# Patient Record
Sex: Male | Born: 1960 | Race: White | Hispanic: No | Marital: Married | Smoking: Never smoker
Health system: Southern US, Community
[De-identification: ages and names within clinical notes are randomized; demographics above are authoritative.]

## PROBLEM LIST (undated history)

## (undated) DIAGNOSIS — H9190 Unspecified hearing loss, unspecified ear: Secondary | ICD-10-CM

## (undated) DIAGNOSIS — S2232XA Fracture of one rib, left side, initial encounter for closed fracture: Secondary | ICD-10-CM

## (undated) DIAGNOSIS — F32A Depression, unspecified: Secondary | ICD-10-CM

## (undated) DIAGNOSIS — M5416 Radiculopathy, lumbar region: Secondary | ICD-10-CM

## (undated) DIAGNOSIS — G4730 Sleep apnea, unspecified: Secondary | ICD-10-CM

## (undated) DIAGNOSIS — I10 Essential (primary) hypertension: Secondary | ICD-10-CM

## (undated) DIAGNOSIS — M1990 Unspecified osteoarthritis, unspecified site: Secondary | ICD-10-CM

## (undated) DIAGNOSIS — Z86010 Personal history of colonic polyps: Secondary | ICD-10-CM

## (undated) HISTORY — PX: WISDOM TOOTH EXTRACTION: SHX21

## (undated) HISTORY — DX: Fracture of one rib, left side, initial encounter for closed fracture: S22.32XA

## (undated) HISTORY — PX: POLYPECTOMY: SHX149

## (undated) HISTORY — DX: Essential (primary) hypertension: I10

## (undated) HISTORY — DX: Sleep apnea, unspecified: G47.30

## (undated) HISTORY — PX: TYMPANOSTOMY: SHX2586

## (undated) HISTORY — DX: Radiculopathy, lumbar region: M54.16

## (undated) HISTORY — DX: Unspecified osteoarthritis, unspecified site: M19.90

## (undated) HISTORY — PX: EYE SURGERY: SHX253

## (undated) HISTORY — DX: Depression, unspecified: F32.A

## (undated) HISTORY — DX: Personal history of colonic polyps: Z86.010

## (undated) HISTORY — DX: Unspecified hearing loss, unspecified ear: H91.90

## (undated) HISTORY — PX: TONSILLECTOMY: SUR1361

## (undated) HISTORY — PX: SPINE SURGERY: SHX786

---

## 1999-12-25 ENCOUNTER — Ambulatory Visit (HOSPITAL_COMMUNITY): Admission: RE | Admit: 1999-12-25 | Discharge: 1999-12-25 | Payer: Self-pay | Admitting: Otolaryngology

## 1999-12-25 ENCOUNTER — Encounter: Payer: Self-pay | Admitting: Otolaryngology

## 2000-05-17 ENCOUNTER — Ambulatory Visit (HOSPITAL_COMMUNITY): Admission: RE | Admit: 2000-05-17 | Discharge: 2000-05-17 | Payer: Self-pay | Admitting: Neurology

## 2000-05-17 ENCOUNTER — Encounter: Payer: Self-pay | Admitting: Neurology

## 2001-05-13 ENCOUNTER — Emergency Department (HOSPITAL_COMMUNITY): Admission: EM | Admit: 2001-05-13 | Discharge: 2001-05-13 | Payer: Self-pay | Admitting: Emergency Medicine

## 2004-01-18 ENCOUNTER — Encounter: Admission: RE | Admit: 2004-01-18 | Discharge: 2004-01-18 | Payer: Self-pay | Admitting: Internal Medicine

## 2004-06-18 ENCOUNTER — Ambulatory Visit: Payer: Self-pay | Admitting: Internal Medicine

## 2004-07-04 ENCOUNTER — Ambulatory Visit: Payer: Self-pay | Admitting: Internal Medicine

## 2004-09-18 ENCOUNTER — Ambulatory Visit: Payer: Self-pay | Admitting: Internal Medicine

## 2005-04-03 ENCOUNTER — Ambulatory Visit: Payer: Self-pay | Admitting: Internal Medicine

## 2005-04-03 ENCOUNTER — Ambulatory Visit: Payer: Self-pay | Admitting: Cardiology

## 2005-04-07 ENCOUNTER — Ambulatory Visit: Payer: Self-pay | Admitting: Internal Medicine

## 2005-05-27 ENCOUNTER — Ambulatory Visit: Payer: Self-pay | Admitting: Internal Medicine

## 2005-06-03 ENCOUNTER — Ambulatory Visit: Payer: Self-pay | Admitting: Internal Medicine

## 2005-08-11 ENCOUNTER — Ambulatory Visit: Payer: Self-pay | Admitting: Internal Medicine

## 2005-08-14 ENCOUNTER — Ambulatory Visit (HOSPITAL_COMMUNITY): Admission: RE | Admit: 2005-08-14 | Discharge: 2005-08-14 | Payer: Self-pay | Admitting: Internal Medicine

## 2005-09-01 ENCOUNTER — Ambulatory Visit: Payer: Self-pay | Admitting: Internal Medicine

## 2005-09-30 ENCOUNTER — Ambulatory Visit: Payer: Self-pay | Admitting: Internal Medicine

## 2006-03-16 HISTORY — PX: OTHER SURGICAL HISTORY: SHX169

## 2006-05-24 ENCOUNTER — Ambulatory Visit: Payer: Self-pay | Admitting: Internal Medicine

## 2006-05-24 LAB — CONVERTED CEMR LAB
ALT: 29 units/L (ref 0–40)
AST: 27 units/L (ref 0–37)
Albumin: 3.9 g/dL (ref 3.5–5.2)
Alkaline Phosphatase: 49 units/L (ref 39–117)
BUN: 15 mg/dL (ref 6–23)
Basophils Absolute: 0 10*3/uL (ref 0.0–0.1)
Basophils Relative: 0.4 % (ref 0.0–1.0)
Bilirubin, Direct: 0.1 mg/dL (ref 0.0–0.3)
CO2: 32 meq/L (ref 19–32)
Calcium: 9.2 mg/dL (ref 8.4–10.5)
Chloride: 103 meq/L (ref 96–112)
Cholesterol: 191 mg/dL (ref 0–200)
Creatinine, Ser: 0.9 mg/dL (ref 0.4–1.5)
Eosinophils Absolute: 0.1 10*3/uL (ref 0.0–0.6)
Eosinophils Relative: 1.6 % (ref 0.0–5.0)
GFR calc Af Amer: 118 mL/min
GFR calc non Af Amer: 97 mL/min
Glucose, Bld: 84 mg/dL (ref 70–99)
HCT: 43.4 % (ref 39.0–52.0)
HDL: 46.4 mg/dL (ref >39.0)
Hemoglobin: 14.7 g/dL (ref 13.0–17.0)
LDL Cholesterol: 124 mg/dL — ABNORMAL HIGH (ref 0–99)
Lymphocytes Relative: 27.5 % (ref 12.0–46.0)
MCHC: 34 g/dL (ref 30.0–36.0)
MCV: 93.3 fL (ref 78.0–100.0)
Monocytes Absolute: 0.4 10*3/uL (ref 0.2–0.7)
Monocytes Relative: 8.6 % (ref 3.0–11.0)
Neutro Abs: 2.9 10*3/uL (ref 1.4–7.7)
Neutrophils Relative %: 61.9 % (ref 43.0–77.0)
PSA: 0.52 ng/mL (ref 0.10–4.00)
Platelets: 192 10*3/uL (ref 150–400)
Potassium: 4 meq/L (ref 3.5–5.1)
RBC: 4.65 M/uL (ref 4.22–5.81)
RDW: 12.2 % (ref 11.5–14.6)
Sodium: 140 meq/L (ref 135–145)
TSH: 1.29 microintl units/mL (ref 0.35–5.50)
Total Bilirubin: 0.9 mg/dL (ref 0.3–1.2)
Total CHOL/HDL Ratio: 4.1
Total Protein: 6.8 g/dL (ref 6.0–8.3)
Triglycerides: 101 mg/dL (ref 0–149)
VLDL: 20 mg/dL (ref 0–40)
WBC: 4.7 10*3/uL (ref 4.5–10.5)

## 2006-05-28 ENCOUNTER — Ambulatory Visit: Payer: Self-pay | Admitting: Internal Medicine

## 2006-06-25 ENCOUNTER — Ambulatory Visit: Payer: Self-pay | Admitting: Internal Medicine

## 2006-09-22 ENCOUNTER — Ambulatory Visit (HOSPITAL_COMMUNITY): Admission: RE | Admit: 2006-09-22 | Discharge: 2006-09-23 | Payer: Self-pay | Admitting: Neurosurgery

## 2007-06-06 ENCOUNTER — Ambulatory Visit: Payer: Self-pay | Admitting: Internal Medicine

## 2007-06-06 LAB — CONVERTED CEMR LAB
ALT: 32 units/L (ref 0–53)
AST: 33 units/L (ref 0–37)
Basophils Relative: 0.9 % (ref 0.0–1.0)
Bilirubin Urine: NEGATIVE
Bilirubin, Direct: 0.1 mg/dL (ref 0.0–0.3)
Blood in Urine, dipstick: NEGATIVE
CO2: 26 meq/L (ref 19–32)
Calcium: 9.1 mg/dL (ref 8.4–10.5)
Chloride: 106 meq/L (ref 96–112)
Eosinophils Absolute: 0.1 10*3/uL (ref 0.0–0.6)
Eosinophils Relative: 2.6 % (ref 0.0–5.0)
GFR calc non Af Amer: 97 mL/min
Glucose, Bld: 79 mg/dL (ref 70–99)
Glucose, Urine, Semiquant: NEGATIVE
HCT: 43 % (ref 39.0–52.0)
Ketones, urine, test strip: NEGATIVE
Lymphocytes Relative: 40.3 % (ref 12.0–46.0)
MCV: 93 fL (ref 78.0–100.0)
Neutrophils Relative %: 46.7 % (ref 43.0–77.0)
Nitrite: NEGATIVE
PSA: 0.48 ng/mL (ref 0.10–4.00)
Platelets: 207 10*3/uL (ref 150–400)
Protein, U semiquant: NEGATIVE
RBC: 4.63 M/uL (ref 4.22–5.81)
Sodium: 140 meq/L (ref 135–145)
Specific Gravity, Urine: 1.01
Total Bilirubin: 0.7 mg/dL (ref 0.3–1.2)
Total Protein: 6.7 g/dL (ref 6.0–8.3)
Urobilinogen, UA: 0.2
VLDL: 66 mg/dL — ABNORMAL HIGH (ref 0–40)
WBC Urine, dipstick: NEGATIVE
WBC: 4.2 10*3/uL — ABNORMAL LOW (ref 4.5–10.5)
pH: 5.5

## 2007-06-10 ENCOUNTER — Ambulatory Visit: Payer: Self-pay | Admitting: Internal Medicine

## 2007-06-10 DIAGNOSIS — I10 Essential (primary) hypertension: Secondary | ICD-10-CM

## 2007-06-10 DIAGNOSIS — M1990 Unspecified osteoarthritis, unspecified site: Secondary | ICD-10-CM | POA: Insufficient documentation

## 2007-06-23 ENCOUNTER — Ambulatory Visit: Payer: Self-pay | Admitting: Internal Medicine

## 2007-07-19 DIAGNOSIS — R079 Chest pain, unspecified: Secondary | ICD-10-CM | POA: Insufficient documentation

## 2007-08-26 ENCOUNTER — Telehealth (INDEPENDENT_AMBULATORY_CARE_PROVIDER_SITE_OTHER): Payer: Self-pay | Admitting: *Deleted

## 2007-09-09 ENCOUNTER — Ambulatory Visit: Payer: Self-pay | Admitting: Internal Medicine

## 2007-09-09 DIAGNOSIS — E785 Hyperlipidemia, unspecified: Secondary | ICD-10-CM | POA: Insufficient documentation

## 2007-09-09 DIAGNOSIS — N411 Chronic prostatitis: Secondary | ICD-10-CM | POA: Insufficient documentation

## 2007-09-09 LAB — CONVERTED CEMR LAB
LDL Cholesterol: 120 mg/dL — ABNORMAL HIGH (ref 0–99)
Total CHOL/HDL Ratio: 4.5
VLDL: 24 mg/dL (ref 0–40)

## 2008-01-11 ENCOUNTER — Telehealth: Payer: Self-pay | Admitting: Internal Medicine

## 2008-04-04 ENCOUNTER — Encounter: Payer: Self-pay | Admitting: Internal Medicine

## 2008-04-06 ENCOUNTER — Encounter: Admission: RE | Admit: 2008-04-06 | Discharge: 2008-04-06 | Payer: Self-pay | Admitting: Neurosurgery

## 2008-04-07 ENCOUNTER — Emergency Department (HOSPITAL_COMMUNITY): Admission: EM | Admit: 2008-04-07 | Discharge: 2008-04-07 | Payer: Self-pay | Admitting: Emergency Medicine

## 2008-06-22 ENCOUNTER — Ambulatory Visit: Payer: Self-pay | Admitting: Internal Medicine

## 2008-06-22 LAB — CONVERTED CEMR LAB
Albumin: 4.4 g/dL (ref 3.5–5.2)
Alkaline Phosphatase: 49 units/L (ref 39–117)
BUN: 12 mg/dL (ref 6–23)
Basophils Absolute: 0 10*3/uL (ref 0.0–0.1)
CO2: 29 meq/L (ref 19–32)
Calcium: 9.2 mg/dL (ref 8.4–10.5)
Cholesterol: 223 mg/dL — ABNORMAL HIGH (ref 0–200)
Creatinine, Ser: 0.9 mg/dL (ref 0.4–1.5)
Direct LDL: 145.3 mg/dL
Eosinophils Absolute: 0 10*3/uL (ref 0.0–0.7)
GFR calc non Af Amer: 96.47 mL/min (ref >60)
Glucose, Bld: 94 mg/dL (ref 70–99)
HDL: 60.4 mg/dL (ref >39.00)
Hemoglobin: 14.7 g/dL (ref 13.0–17.0)
Leukocytes, UA: NEGATIVE
Lymphocytes Relative: 21.8 % (ref 12.0–46.0)
MCHC: 34.8 g/dL (ref 30.0–36.0)
Monocytes Relative: 8.3 % (ref 3.0–12.0)
Neutro Abs: 3.7 10*3/uL (ref 1.4–7.7)
PSA: 0.49 ng/mL (ref 0.10–4.00)
Platelets: 151 10*3/uL (ref 150.0–400.0)
RDW: 12.5 % (ref 11.5–14.6)
Sodium: 139 meq/L (ref 135–145)
Specific Gravity, Urine: <=1.005 (ref 1.000–1.030)
TSH: 0.96 microintl units/mL (ref 0.35–5.50)
Total Bilirubin: 1.3 mg/dL — ABNORMAL HIGH (ref 0.3–1.2)
Triglycerides: 53 mg/dL (ref 0.0–149.0)
Urine Glucose: NEGATIVE mg/dL
Urobilinogen, UA: 0.2 (ref 0.0–1.0)
pH: 6 (ref 5.0–8.0)

## 2008-06-29 ENCOUNTER — Ambulatory Visit: Payer: Self-pay | Admitting: Internal Medicine

## 2008-09-21 ENCOUNTER — Telehealth: Payer: Self-pay | Admitting: Internal Medicine

## 2008-11-23 ENCOUNTER — Telehealth: Payer: Self-pay | Admitting: *Deleted

## 2009-06-25 ENCOUNTER — Ambulatory Visit: Payer: Self-pay | Admitting: Internal Medicine

## 2009-06-25 LAB — CONVERTED CEMR LAB
Albumin: 4.6 g/dL (ref 3.5–5.2)
Alkaline Phosphatase: 44 units/L (ref 39–117)
Basophils Relative: 0.6 % (ref 0.0–3.0)
CO2: 29 meq/L (ref 19–32)
Calcium: 9.2 mg/dL (ref 8.4–10.5)
Chloride: 103 meq/L (ref 96–112)
Eosinophils Absolute: 0.1 10*3/uL (ref 0.0–0.7)
Glucose, Urine, Semiquant: NEGATIVE
HCT: 43.9 % (ref 39.0–52.0)
Hemoglobin: 15.4 g/dL (ref 13.0–17.0)
Lymphocytes Relative: 31 % (ref 12.0–46.0)
MCHC: 35 g/dL (ref 30.0–36.0)
MCV: 95 fL (ref 78.0–100.0)
Neutro Abs: 2.3 10*3/uL (ref 1.4–7.7)
Nitrite: NEGATIVE
Potassium: 4.3 meq/L (ref 3.5–5.1)
RBC: 4.62 M/uL (ref 4.22–5.81)
Sodium: 140 meq/L (ref 135–145)
Specific Gravity, Urine: 1.015
Total CHOL/HDL Ratio: 3
Total Protein: 7.1 g/dL (ref 6.0–8.3)
WBC Urine, dipstick: NEGATIVE

## 2009-07-01 ENCOUNTER — Ambulatory Visit: Payer: Self-pay | Admitting: Internal Medicine

## 2009-07-01 LAB — CONVERTED CEMR LAB: Cholesterol, target level: 200 mg/dL

## 2009-07-08 ENCOUNTER — Encounter: Payer: Self-pay | Admitting: Internal Medicine

## 2010-01-01 ENCOUNTER — Telehealth: Payer: Self-pay | Admitting: Internal Medicine

## 2010-03-13 ENCOUNTER — Telehealth: Payer: Self-pay | Admitting: Internal Medicine

## 2010-04-15 NOTE — Assessment & Plan Note (Signed)
Summary: cpx/cjr   Vital Signs:  Patient profile:   50 year old male Height:      75 inches Weight:      210 pounds BMI:     26.34 Temp:     98.2 degrees F oral Pulse rate:   68 / minute Resp:     14 per minute BP sitting:   136 / 80  (left arm)  Vitals Entered By: Willy Eddy, LPN (July 01, 2009 2:12 PM)  Nutrition Counseling: Patient's BMI is greater than 25 and therefore counseled on weight management options. CC: cpx, Lipid Management   CC:  cpx and Lipid Management.  History of Present Illness: The pt was asked about all immunizations, health maint. services that are appropriate to their age and was given guidance on diet exercize  and weight management   Lipid Management History:      Positive NCEP/ATP III risk factors include male age 17 years old or older and hypertension.  Negative NCEP/ATP III risk factors include non-tobacco-user status.    Preventive Screening-Counseling & Management  Alcohol-Tobacco     Smoking Status: never  Problems Prior to Update: 1)  Chronic Prostatitis  (ICD-601.1) 2)  Hyperlipidemia  (ICD-272.4) 3)  Chest Pain Unspecified  (ICD-786.50) 4)  Preventive Health Care  (ICD-V70.0) 5)  Osteoarthritis  (ICD-715.90) 6)  Hypertension  (ICD-401.9) 7)  Routine General Medical Exam@health  Care Facl  (ICD-V70.0)  Current Problems (verified): 1)  Chronic Prostatitis  (ICD-601.1) 2)  Hyperlipidemia  (ICD-272.4) 3)  Chest Pain Unspecified  (ICD-786.50) 4)  Preventive Health Care  (ICD-V70.0) 5)  Osteoarthritis  (ICD-715.90) 6)  Hypertension  (ICD-401.9) 7)  Routine General Medical Exam@health  Care Facl  (ICD-V70.0)  Medications Prior to Update: 1)  Krill Oil 1000 Mg Caps (Krill Oil) .... Two By Mouth Two Times A Day 2)  Mega Multi Men  Cr-Tabs (Multiple Vitamins-Minerals) 3)  Ambien 10 Mg Tabs (Zolpidem Tartrate) .... One By Mouth Q Hs  Current Medications (verified): 1)  Krill Oil 1000 Mg Caps (Krill Oil) .... Two By Mouth Two  Times A Day 2)  Mega Multi Men  Cr-Tabs (Multiple Vitamins-Minerals) 3)  Ambien 10 Mg Tabs (Zolpidem Tartrate) .Marland Kitchen.. 1 At Bedtime As Needed Sleep When Traveling  Allergies (verified): No Known Drug Allergies  Past History:  Family History: Last updated: 09/09/2006 Fam hx Renal CA  Social History: Last updated: 09/09/2006 Never Smoked Alcohol use-yes Drug use-no Married  Risk Factors: Smoking Status: never (07/01/2009)  Past medical, surgical, family and social histories (including risk factors) reviewed, and no changes noted (except as noted below).  Past Medical History: Reviewed history from 06/10/2007 and no changes required. Hypertension RADICULOPATHY  L4-5 left Osteoarthritis  Past Surgical History: Reviewed history from 06/10/2007 and no changes required. l4-5  Lumbar laminectomy Lumbar fusion  ( roy)  Family History: Reviewed history from 09/09/2006 and no changes required. Fam hx Renal CA  Social History: Reviewed history from 09/09/2006 and no changes required. Never Smoked Alcohol use-yes Drug use-no Married  Review of Systems  The patient denies anorexia, fever, weight loss, weight gain, vision loss, decreased hearing, hoarseness, chest pain, syncope, dyspnea on exertion, peripheral edema, prolonged cough, headaches, hemoptysis, abdominal pain, melena, hematochezia, severe indigestion/heartburn, hematuria, incontinence, genital sores, muscle weakness, suspicious skin lesions, transient blindness, difficulty walking, depression, unusual weight change, abnormal bleeding, enlarged lymph nodes, angioedema, and breast masses.    Physical Exam  General:  Well-developed,well-nourished,in no acute distress; alert,appropriate and cooperative throughout examination Head:  Normocephalic and atraumatic without obvious abnormalities. No apparent alopecia or balding. Eyes:  No corneal or conjunctival inflammation noted. EOMI. Perrla. Funduscopic exam benign,  without hemorrhages, exudates or papilledema. Vision grossly normal. Nose:  External nasal examination shows no deformity or inflammation. Nasal mucosa are pink and moist without lesions or exudates. Mouth:  Oral mucosa and oropharynx without lesions or exudates.  Teeth in good repair. Neck:  No deformities, masses, or tenderness noted. Breasts:  No masses or gynecomastia noted Lungs:  Normal respiratory effort, chest expands symmetrically. Lungs are clear to auscultation, no crackles or wheezes. Heart:  Normal rate and regular rhythm. S1 and S2 normal without gallop, murmur, click, rub or other extra sounds. Abdomen:  Bowel sounds positive,abdomen soft and non-tender without masses, organomegaly or hernias noted. Genitalia:  Testes bilaterally descended without nodularity, tenderness or masses. No scrotal masses or lesions. No penis lesions or urethral discharge. Prostate:  no asymmetry and 1+ enlarged.   Msk:  No deformity or scoliosis noted of thoracic or lumbar spine.   Pulses:  R and L carotid,radial,femoral,dorsalis pedis and posterior tibial pulses are full and equal bilaterally Extremities:  No clubbing, cyanosis, edema, or deformity noted with normal full range of motion of all joints.   Neurologic:  No cranial nerve deficits noted. Station and gait are normal. Plantar reflexes are down-going bilaterally. DTRs are symmetrical throughout. Sensory, motor and coordinative functions appear intact. Skin:  Intact without suspicious lesions or rashes Psych:  Oriented X3 and memory intact for recent and remote.     Impression & Recommendations:  Problem # 1:  PREVENTIVE HEALTH CARE (ICD-V70.0)  Td Booster: Tdap (06/29/2008)   Flu Vax: Historical (03/17/2003)   Chol: 193 (06/25/2009)   HDL: 57.30 (06/25/2009)   LDL: 112 (06/25/2009)   TG: 118.0 (06/25/2009) TSH: 1.27 (06/25/2009)   PSA: 0.55 (06/25/2009)  Discussed using sunscreen, use of alcohol, drug use, self testicular exam, routine  dental care, routine eye care, routine physical exam, seat belts, multiple vitamins, osteoporosis prevention, adequate calcium intake in diet, and recommendations for immunizations.  Discussed exercise and checking cholesterol.  Discussed gun safety, safe sex, and contraception. Also recommend checking PSA.  Complete Medication List: 1)  Krill Oil 1000 Mg Caps (Krill oil) .... Two by mouth two times a day 2)  Mega Multi Men Cr-tabs (Multiple vitamins-minerals) 3)  Ambien 10 Mg Tabs (Zolpidem tartrate) .Marland Kitchen.. 1 at bedtime as needed sleep when traveling  Lipid Assessment/Plan:      Based on NCEP/ATP III, the patient's risk factor category is "2 or more risk factors and a calculated 10 year CAD risk of < 20%".  The patient's lipid goals are as follows: Total cholesterol goal is 200; LDL cholesterol goal is 130; HDL cholesterol goal is 40; Triglyceride goal is 150.     Patient Instructions: 1)  increase the saw palmeto to two times a day 2)  Please schedule a follow-up appointment in 1 year.  CPX  Appended Document: Orders Update     Clinical Lists Changes  Orders: Added new Service order of Est. Patient 40-64 years ([id number]) - Signed

## 2010-04-15 NOTE — Progress Notes (Signed)
Summary: refill zolpiden  Phone Note Refill Request Message from:  Fax from Pharmacy on January 01, 2010 3:34 PM  Refills Requested: Medication #1:  AMBIEN 10 MG TABS 1 at bedtime as needed sleep when traveling.   [facility]: [facility]   Method Requested: Fax to Local Pharmacy Initial call taken by: Duard Grey LPN,  January 01, 2010 3:34 PM    Prescriptions: AMBIEN 10 MG TABS (ZOLPIDEM TARTRATE) 1 at bedtime as needed sleep when traveling  #30 x 0   Entered by:   Duard Leece LPN   Authorized by:   Stacie Glaze MD   Signed by:   Duard Elgersma LPN on 16/12/9602   Method used:   Historical   RxID:   [id number]  [facility]   KIK

## 2010-04-15 NOTE — Letter (Signed)
Summary: [facility]  [facility]   Imported By: Maryln Gottron 08/08/2009 14:52:08  _____________________________________________________________________  External Attachment:    Type:   Image     Comment:   External Document

## 2010-04-17 NOTE — Progress Notes (Signed)
Summary: refill zolpidem  Phone Note From Pharmacy   [facility]  [address] [phone]* Call For: Curtis Hendrix  Summary of Call: refill zolpidem 10mg  1 by mouth at bedtime Initial call taken by: Alfred Levins, CMA,  March 13, 2010 8:18 AM  Follow-up for Phone Call        may refill x 3 Follow-up by: Stacie Glaze MD,  March 14, 2010 2:04 PM    Prescriptions: AMBIEN 10 MG TABS (ZOLPIDEM TARTRATE) 1 at bedtime as needed sleep when traveling  #30 x 3   Entered by:   Lynann Beaver CMA AAMA   Authorized by:   Stacie Glaze MD   Signed by:   Lynann Beaver CMA AAMA on 03/14/2010   Method used:   Telephoned to ...       [facility]  [address] [phone]* (retail)       [address]       [place], [place]  [address]       Ph: [phone] or [phone]       Fax: [phone]   RxID:   [id number]

## 2010-07-29 NOTE — H&P (Signed)
Curtis Hendrix, KLEM NO.:  [id number]   MEDICAL RECORD NO.:  [id number]          PATIENT TYPE:  OIB   LOCATION:  5148                         FACILITY:  [facility]   PHYSICIAN:  Payton Doughty, M.D.      DATE OF BIRTH:  1960/07/23   DATE OF ADMISSION:  09/22/2006  DATE OF DISCHARGE:  09/23/2006                              HISTORY & PHYSICAL   ADMITTING DIAGNOSIS:  Herniated disc on the left side at L4-5.   BODY OF TEXT:  A very nice 50 year old, right-handed white gentleman who  about a week ago had the onset of back pain, worsening, then pain down  his left leg, and difficulty with dorsiflexion of the left foot MRI was  obtained that showed a herniated disc on the left side at L4-5.  He is  being admitted for diskectomy.   MEDICAL HISTORY:  1. Benign.  2. 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  3. He has a little bit of hearing loss in his left ear.   He is on no medications and has no allergies.   SURGICAL HISTORY:  None.   SOCIAL HISTORY:  He does not smoke.  He does not drink.  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   FAMILY HISTORY:  Mom is 20.  Dad is 58.  Both are in good health.   REVIEW OF SYSTEMS:  Remarkable for hearing loss, sinus problems, and  headache.   PHYSICAL EXAMINATION:  HEENT: Exam normal limits.  NECK:  He has good range of motion of the neck.  CHEST:  Clear.  CARDIAC:  Regular rate and rhythm.  ABDOMEN:  Nontender.  No hepatosplenomegaly.  EXTREMITIES:  Without clubbing or cyanosis.  GU:  Deferred.  EXTREMITIES:  Peripheral pulses are good.  NEUROLOGICAL:  He is awake, alert and oriented.  Cognitively appears to  function normally.  Motor exam showed a 5/5 strength throughout the  upper and lower extremities, save to dorsiflexion on the left there is  3/5.  Straight leg raise is positive.  Has an absent ankle jerk on the  left, and a left L5 and S1 sensory deficit.   MRI results been reviewed  above.   CLINICAL IMPRESSION:  Left L5 radiculopathy related disc.   PLAN:  For lumbar laminectomy and diskectomy.  The risks and benefits  have been discussed with him.  He wish to proceed.           ______________________________  Payton Doughty, M.D.     MWR/MEDQ  D:  09/22/2006  T:  09/23/2006  Job:  [id number]

## 2010-07-29 NOTE — Op Note (Signed)
NAMEMOHANNAD, OLIVERO NO.:  [id number]   MEDICAL RECORD NO.:  [id number]          PATIENT TYPE:  OIB   LOCATION:  3172                         FACILITY:  [facility]   PHYSICIAN:  Payton Doughty, M.D.      DATE OF BIRTH:  1960-12-04   DATE OF PROCEDURE:  09/22/2006  DATE OF DISCHARGE:                               OPERATIVE REPORT   PREOPERATIVE DIAGNOSIS:  A herniated disk on the left side at L4-5.   POSTOPERATIVE DIAGNOSIS:  A herniated disk on the left side at L4-5.   OPERATIVE PROCEDURE:  Left L4-5 laminectomy, diskectomy.   ANESTHESIA:  General endotracheal.   PREP:  Sterile Betadine prep and scrub with alcohol wipe.   COMPLICATIONS:  None.   NURSE ASSISTANT:  [place]   BODY OF TEXT:  This is a 50 year old gentleman with a left herniated  disk at 4-5 on the left and foot drop.  Taken to operating room,  smoothly anesthetized, intubated, placed prone on the operating table  following shave, prep, drape in usual sterile fashion, skin was  infiltrated with 1% lidocaine with 1:400,000 epinephrine and skin was  incised over the L4 lamina.  Lamina was dissected free in subperiosteal  plane.  Intraoperative x-ray confirmed correctness of level; having  confirmed correctness of the level, the hemisemilaminectomy of L4 was  carried out just past the top of the ligamentum flavum.  It was removed  in retrograde fashion the lateral aspect of thecal sac was identified,  retracted medially.  The L4 root was identified as it entered the neural  foramen just beneath this was free fragment of disk that was grasped and  removed without difficulty.  The anterior epidural space and the thecal  sac and the neural foramen carefully explored and found to be free.  The  wound was irrigated.  Hemostasis assured.  The laminectomy defect filled  Depo-Medrol soaked fat.  Successive layers of 0-0 Vicryl, 2-0 Vicryl,  and 4-0 Vicryl used to close.  Benzoin, Steri-Strips placed made  occlusive Telfa and OpSite and the patient returned recovery room in  good condition.           ______________________________  Payton Doughty, M.D.     MWR/MEDQ  D:  09/22/2006  T:  09/23/2006  Job:  [id number]

## 2010-08-01 NOTE — Consult Note (Signed)
[place]  Patient:    Curtis Hendrix, Curtis Hendrix Visit Number: [id number] MRN: [id number]          Service Type: EMS Location: [place] Attending Physician:  Devoria Albe Dictated by:   Jeannett Senior Pollyann Kennedy, M.D. Proc. Date: 05/13/01 Admit Date:  05/13/2001   CC:         Stacie Glaze, M.D. [facility]   Consultation Report  REASON FOR CONSULTATION:  Laceration of the lip, complex.  PRIMARY CARE PHYSICIAN:  Stacie Glaze, M.D.  HISTORY OF PRESENT ILLNESS:  This is a 50 year old gentleman who was at a party this evening, had several drinks, and fell apparently on the ice outside and hit the right side of his head and upper lip.  He has a loose upper central incisor and his dentist acquaintance have checked that and is going to see him tomorrow in the office to see if that can be treated.  He has requested maxillofacial surgeon to repair the lip laceration.  PAST MEDICAL HISTORY:  Negative.  No history of smoking.  ALLERGIES:  No known allergies.  MEDICATIONS:  None.  PHYSICAL EXAMINATION:  GENERAL:  A healthy-appearing gentleman in no distress.  NECK:  No palpable neck masses.  HEENT:  Oral cavity and pharynx are clear.  The right upper middle incisor is loose but in place.  Edema, swelling, and ecchymosis right frontotemporal scalp area.  The upper lip is significant for a complex laceration through the vermilion border about 1 cm below the border and another centimeter and a half above the border, all through the sulcus of the upper lip.  It starts off to the right of midline and as it travels superiorly it bends over towards the left side.  PROCEDURES:  Using sterile technique, the upper lip was infiltrated with 1% Xylocaine with epinephrine.  The wound was carefully cleaned out with Betadine solution.  The vermilion border was carefully lined up using 6-0 nylon suture.  The deep muscular layer in the vermilion lip was reapproximated with several buried  simple 5-0 Vicryl sutures.  The skin was reapproximated with 6-0 nylon sutures.  The remaining vermilion was reapproximated with inverted interrupted 5-0 Vicryl sutures.  The wound was cleaned up with saline and instructions were given for him to keep it clean and dry as possible and apply bacitracin ointment a couple of times a day.  FOLLOW-UP:  He will follow up in the office next week for suture removal. Dictated by:   Jeannett Senior. Pollyann Kennedy, M.D. Attending Physician:  Devoria Albe DD:  05/13/01 TD:  05/13/01 Job: [id number]

## 2010-09-24 ENCOUNTER — Other Ambulatory Visit: Payer: Self-pay | Admitting: *Deleted

## 2010-09-24 MED ORDER — ZOLPIDEM TARTRATE 10 MG PO TABS: 10.0000 mg | ORAL_TABLET | Freq: Every evening | ORAL | Status: DC | PRN

## 2010-11-03 ENCOUNTER — Telehealth: Payer: Self-pay | Admitting: Internal Medicine

## 2010-11-03 NOTE — Telephone Encounter (Signed)
Curtis Hendrix, this pt called 8/20. He said he needs a quick check up for throat issues. States he needs to be worked in on Monday 8/27. Told me that you know him and that if necessary he will bring some work and sit in the lobby all day til you can get him in. Is this typical for this gentleman? Should I fit him in on the 8/27 - if so, where? Thanks for the help! He is expecting a call from me today (Tues 8/21) with an appt.

## 2010-11-10 NOTE — Telephone Encounter (Signed)
Pt never called back.

## 2010-11-10 NOTE — Telephone Encounter (Signed)
Ov on tuesday

## 2010-11-10 NOTE — Telephone Encounter (Signed)
fyi

## 2010-11-11 ENCOUNTER — Encounter: Payer: Self-pay | Admitting: Internal Medicine

## 2010-11-11 ENCOUNTER — Ambulatory Visit (INDEPENDENT_AMBULATORY_CARE_PROVIDER_SITE_OTHER): Payer: BC Managed Care – PPO | Admitting: Internal Medicine

## 2010-11-11 VITALS — BP 140/80 | HR 76 | Temp 98.2°F | Resp 16 | Ht 72.0 in | Wt 201.0 lb

## 2010-11-11 DIAGNOSIS — K1370 Unspecified lesions of oral mucosa: Secondary | ICD-10-CM

## 2010-11-11 DIAGNOSIS — R4589 Other symptoms and signs involving emotional state: Secondary | ICD-10-CM

## 2010-11-11 DIAGNOSIS — Z9189 Other specified personal risk factors, not elsewhere classified: Secondary | ICD-10-CM

## 2010-11-11 DIAGNOSIS — Z202 Contact with and (suspected) exposure to infections with a predominantly sexual mode of transmission: Secondary | ICD-10-CM

## 2010-11-11 DIAGNOSIS — K1379 Other lesions of oral mucosa: Secondary | ICD-10-CM

## 2010-11-11 DIAGNOSIS — R300 Dysuria: Secondary | ICD-10-CM

## 2010-11-11 DIAGNOSIS — F410 Panic disorder [episodic paroxysmal anxiety] without agoraphobia: Secondary | ICD-10-CM

## 2010-11-11 MED ORDER — CEFUROXIME AXETIL 250 MG PO TABS: 250.0000 mg | ORAL_TABLET | Freq: Two times a day (BID) | ORAL | Status: AC

## 2010-11-11 MED ORDER — VALACYCLOVIR HCL 1 G PO TABS: 1000.0000 mg | ORAL_TABLET | Freq: Three times a day (TID) | ORAL | Status: AC

## 2010-11-11 MED ORDER — ALPRAZOLAM 0.5 MG PO TABS: 0.5000 mg | ORAL_TABLET | Freq: Three times a day (TID) | ORAL | Status: DC | PRN

## 2010-11-11 MED ORDER — FIRST-DUKES MOUTHWASH MT SUSP: 10.0000 mL | Freq: Four times a day (QID) | OROMUCOSAL | Status: DC

## 2010-11-11 NOTE — Progress Notes (Signed)
  Subjective:    Patient ID: Curtis Hendrix, male    DOB: 21-Oct-1960, 50 y.o.   MRN: [id number]  HPI exposure on August 7th Exposure to wife on 12 Mouth sores started on 13 Seen at urgent care and HSV virus positive Seen at urgent care and started on acyclovir and diflucan August 20th started Increased anxiety Panic attacks depression  Review of Systems  Constitutional: Negative for fever and fatigue.  HENT: Negative for hearing loss, congestion, neck pain and postnasal drip.        Habitus ulcers in the gutter of the left upper mouth and irritation and inflammation to the lateral aspects of the left tongue  Eyes: Negative for discharge, redness and visual disturbance.  Respiratory: Negative for cough, shortness of breath and wheezing.   Cardiovascular: Negative for leg swelling.  Gastrointestinal: Negative for abdominal pain, constipation and abdominal distention.  Genitourinary: Negative for urgency and frequency.  Musculoskeletal: Negative for joint swelling and arthralgias.  Skin: Negative for color change and rash.  Neurological: Negative for weakness and light-headedness.  Hematological: Negative for adenopathy.  Psychiatric/Behavioral: Negative for behavioral problems.   Past Medical History  Diagnosis Date  . Hypertension   . Radiculopathy, lumbar region   . Arthritis    Past Surgical History  Procedure Date  . L4-5   . Lumbar lamninectomy   . Lumbar fusion     dr Channing Mutters    reports that he has never smoked. He does not have any smokeless tobacco history on file. He reports that he drinks alcohol. He reports that he does not use illicit drugs. family history includes Kidney cancer in an unspecified family member. No Known Allergies     Objective:   Physical Exam  Nursing note and vitals reviewed. Constitutional: He appears well-developed and well-nourished.  HENT:  Head: Normocephalic and atraumatic.       No definitive ulcerations seen marked inflammation to  the left lateral tongue and redness along the gumline on the left. No tonsillar hypertrophy detected no adenopathy in the cervical anterior or posterior chains  Eyes: Conjunctivae are normal. Pupils are equal, round, and reactive to light.  Neck: Normal range of motion. Neck supple.  Cardiovascular: Normal rate and regular rhythm.   Pulmonary/Chest: Effort normal and breath sounds normal.  Abdominal: Soft. Bowel sounds are normal.  Skin: Skin is warm and dry.  Psychiatric:       Anxious with panic attacks          Assessment & Plan:  Patient has exposure to to be from an unknown companion  There is a chance that his exposure was to someone who might cause that single exposure to be a multiple exposure(professional). He has extreme anxiety and worry and required greater than 45 minutes of face-to-face counseling today to talk about how to deal with the problem and how to deal with the stress in his life.  We will screen him for herpes simplex IgM and IgG as well as we have accomplished a mouth swab for herpes.  Exam of the mouth is not consistent with acute herpes and his IgG titer did not indicate that this is an acute herpetic event his chronic mild soreness is in part caused by his chronic use of peroxide to clean his mouth he'll be placed on Dukes mouthwash valtrex  cephalosporin and laboratory values will be monitored and medicated to the patient on Friday

## 2010-11-13 LAB — HERPES SIMPLEX VIRUS CULTURE: Organism ID, Bacteria: NOT DETECTED

## 2010-11-18 LAB — HSV(HERPES SMPLX)ABS-I+II(IGG+IGM)-BLD: HSV 1 Glycoprotein G Ab, IgG: 9.58 IV — ABNORMAL HIGH

## 2010-11-20 ENCOUNTER — Telehealth: Payer: Self-pay | Admitting: *Deleted

## 2010-11-20 NOTE — Telephone Encounter (Signed)
Pt is asking for lab results before the weekend and a refill on Ceftin for residual symptoms in mouth.

## 2010-11-20 NOTE — Telephone Encounter (Signed)
It does not appear that he needs additional antibiotic

## 2010-11-20 NOTE — Telephone Encounter (Signed)
Dr. Cato Mulligan, Will you ok a refill on Ceftin. Please read last office visit and lab results.

## 2010-11-21 ENCOUNTER — Ambulatory Visit (INDEPENDENT_AMBULATORY_CARE_PROVIDER_SITE_OTHER): Payer: Self-pay | Admitting: Psychology

## 2010-11-21 DIAGNOSIS — F1010 Alcohol abuse, uncomplicated: Secondary | ICD-10-CM

## 2010-11-21 MED ORDER — VALACYCLOVIR HCL 1 G PO TABS: 1000.0000 mg | ORAL_TABLET | Freq: Three times a day (TID) | ORAL | Status: DC

## 2010-11-21 NOTE — Telephone Encounter (Signed)
Pt. Notified.

## 2010-11-21 NOTE — Telephone Encounter (Addendum)
LMTCB with Dr Lovell Sheehan" recommendations.  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

## 2010-11-26 ENCOUNTER — Telehealth: Payer: Self-pay | Admitting: *Deleted

## 2010-11-26 NOTE — Telephone Encounter (Signed)
Pt informed ov in am at 8;30

## 2010-11-26 NOTE — Telephone Encounter (Signed)
Pt would like to talk to Dr. Lovell Sheehan on the phone or come in for appt.

## 2010-11-27 ENCOUNTER — Ambulatory Visit (INDEPENDENT_AMBULATORY_CARE_PROVIDER_SITE_OTHER): Payer: BC Managed Care – PPO | Admitting: Internal Medicine

## 2010-11-27 ENCOUNTER — Encounter: Payer: Self-pay | Admitting: Internal Medicine

## 2010-11-27 DIAGNOSIS — K1370 Unspecified lesions of oral mucosa: Secondary | ICD-10-CM

## 2010-11-27 DIAGNOSIS — K1379 Other lesions of oral mucosa: Secondary | ICD-10-CM

## 2010-11-27 DIAGNOSIS — Z202 Contact with and (suspected) exposure to infections with a predominantly sexual mode of transmission: Secondary | ICD-10-CM

## 2010-11-27 DIAGNOSIS — Z9189 Other specified personal risk factors, not elsewhere classified: Secondary | ICD-10-CM

## 2010-11-27 DIAGNOSIS — Z20828 Contact with and (suspected) exposure to other viral communicable diseases: Secondary | ICD-10-CM

## 2010-11-27 MED ORDER — FIRST-DUKES MOUTHWASH MT SUSP: 10.0000 mL | Freq: Four times a day (QID) | OROMUCOSAL | Status: DC

## 2010-11-27 MED ORDER — VALACYCLOVIR HCL 500 MG PO TABS: 500.0000 mg | ORAL_TABLET | Freq: Every day | ORAL | Status: DC

## 2010-11-27 NOTE — Progress Notes (Signed)
  Subjective:    Patient ID: Curtis Hendrix, male    DOB: [birth year], 50 y.o.   MRN: [id number]  HPI Lengthy discussion about exposure risks or virus HIV DNA quantitative today to assure the patient minimal risk.  The patient does have an oral herpetic lesion which we can have ongoing prophylaxis as per face to face counseling with the patient   Review of Systems  Constitutional: Negative for fever and fatigue.  HENT: Negative for hearing loss, congestion, neck pain and postnasal drip.   Eyes: Negative for discharge, redness and visual disturbance.  Respiratory: Negative for cough, shortness of breath and wheezing.   Cardiovascular: Negative for leg swelling.  Gastrointestinal: Negative for abdominal pain, constipation and abdominal distention.  Genitourinary: Negative for urgency and frequency.  Musculoskeletal: Negative for joint swelling and arthralgias.  Skin: Negative for color change and rash.  Neurological: Negative for weakness and light-headedness.  Hematological: Negative for adenopathy.  Psychiatric/Behavioral: Negative for behavioral problems.   Past Medical History  Diagnosis Date  . Hypertension   . Radiculopathy, lumbar region   . Arthritis    Past Surgical History  Procedure Date  . L4-5   . Lumbar lamninectomy   . Lumbar fusion     dr Channing Mutters    reports that he has never smoked. He does not have any smokeless tobacco history on file. He reports that he drinks alcohol. He reports that he does not use illicit drugs. family history includes Kidney cancer in an unspecified family member. No Known Allergies     Objective:   Physical Exam  Vitals reviewed. Constitutional: He appears well-developed and well-nourished.  HENT:  Head: Normocephalic and atraumatic.  Eyes: Conjunctivae are normal. Pupils are equal, round, and reactive to light.  Neck: Normal range of motion. Neck supple.  Cardiovascular: Normal rate and regular rhythm.   Pulmonary/Chest: Effort normal  and breath sounds normal.  Abdominal: Soft. Bowel sounds are normal.   Oral lesions seen in the left posterior base of tongue appears to be herpetic and a aphthous ulcer along the gumline on the left upper come       Assessment & Plan:   I have spent more than 30 minutes examining this patient face-to-face of which over half was spent in counseling-continue Magic mouthwash 4 times a day Valtrex 500 mg by mouth daily consideration for referral to ENT if lesion does not heal in HIV testing today

## 2010-11-28 ENCOUNTER — Ambulatory Visit (INDEPENDENT_AMBULATORY_CARE_PROVIDER_SITE_OTHER): Payer: BC Managed Care – PPO | Admitting: Psychology

## 2010-11-28 DIAGNOSIS — F1010 Alcohol abuse, uncomplicated: Secondary | ICD-10-CM

## 2010-11-28 DIAGNOSIS — F331 Major depressive disorder, recurrent, moderate: Secondary | ICD-10-CM

## 2010-12-02 LAB — HIV-1 RNA, QUALITATIVE, TMA: HIV-1 RNA, Qualitative, TMA: NOT DETECTED

## 2010-12-04 ENCOUNTER — Ambulatory Visit: Payer: BC Managed Care – PPO | Admitting: Psychology

## 2010-12-09 ENCOUNTER — Other Ambulatory Visit: Payer: Self-pay | Admitting: *Deleted

## 2010-12-09 MED ORDER — ZOLPIDEM TARTRATE 10 MG PO TABS: 10.0000 mg | ORAL_TABLET | Freq: Every evening | ORAL | Status: DC | PRN

## 2010-12-10 ENCOUNTER — Encounter: Payer: Self-pay | Admitting: Internal Medicine

## 2010-12-10 ENCOUNTER — Ambulatory Visit (INDEPENDENT_AMBULATORY_CARE_PROVIDER_SITE_OTHER): Payer: BC Managed Care – PPO | Admitting: Internal Medicine

## 2010-12-10 VITALS — BP 140/84 | HR 76 | Temp 98.2°F | Resp 16 | Ht 75.6 in | Wt 204.0 lb

## 2010-12-10 DIAGNOSIS — F430 Acute stress reaction: Secondary | ICD-10-CM

## 2010-12-10 DIAGNOSIS — R4589 Other symptoms and signs involving emotional state: Secondary | ICD-10-CM

## 2010-12-10 DIAGNOSIS — Z209 Contact with and (suspected) exposure to unspecified communicable disease: Secondary | ICD-10-CM

## 2010-12-10 MED ORDER — VALACYCLOVIR HCL 500 MG PO TABS: 500.0000 mg | ORAL_TABLET | Freq: Two times a day (BID) | ORAL | Status: DC

## 2010-12-10 MED ORDER — ALPRAZOLAM 0.5 MG PO TABS: 0.5000 mg | ORAL_TABLET | Freq: Three times a day (TID) | ORAL | Status: DC | PRN

## 2010-12-10 NOTE — Progress Notes (Signed)
  Subjective:    Patient ID: Curtis Hendrix, male    DOB: Jul 25, 1960, 50 y.o.   MRN: [id number]  HPI This is a second lengthy discussion about STD exposure. His persistent lesions in his mouth at the site of the original herpetic infection as well as some intermittent adenopathy feverish feeling and a metallic taste in his mouth from the use of Zovirax.  We discussed initial herpetic infections and how that the symptoms may persist for several weeks.  We also discussed other possible disease states such as syphilis and will screen for that today on examination of his oropharynx we saw no indication of spreading or new lesions the lesions had reduced in size and inflammation This ulcers had responded to manage mouthwash.     Review of Systems  Constitutional: Negative for fever and fatigue.  HENT: Negative for hearing loss, congestion, neck pain and postnasal drip.   Eyes: Negative for discharge, redness and visual disturbance.  Respiratory: Negative for cough, shortness of breath and wheezing.   Cardiovascular: Negative for leg swelling.  Gastrointestinal: Negative for abdominal pain, constipation and abdominal distention.  Genitourinary: Negative for urgency and frequency.  Musculoskeletal: Negative for joint swelling and arthralgias.  Skin: Negative for color change and rash.  Neurological: Negative for weakness and light-headedness.  Hematological: Negative for adenopathy.  Psychiatric/Behavioral: Negative for behavioral problems.       Objective:   Physical Exam  Constitutional: He appears well-developed and well-nourished.  HENT:  Head: Normocephalic and atraumatic.       Lesion at the left base of tongue shoddy adenopathy no lymph nodes larger than a centimeter are detected in  Eyes: Conjunctivae are normal. Pupils are equal, round, and reactive to light.  Neck: Normal range of motion. Neck supple.  Cardiovascular: Normal rate and regular rhythm.   Pulmonary/Chest: Effort  normal and breath sounds normal.  Abdominal: Soft. Bowel sounds are normal.          Assessment & Plan:  New herpetic lesion continuing to resolve.  Acute anxiety syndrome over social issues with worsened tension continue psychiatric counseling.  Complete screening for STDs with a RPR today.  He spent greater than 30 minutes face-to-face with this patient counseling

## 2010-12-11 LAB — RPR

## 2010-12-15 ENCOUNTER — Ambulatory Visit (INDEPENDENT_AMBULATORY_CARE_PROVIDER_SITE_OTHER): Payer: BC Managed Care – PPO | Admitting: Psychology

## 2010-12-15 DIAGNOSIS — F331 Major depressive disorder, recurrent, moderate: Secondary | ICD-10-CM

## 2010-12-15 DIAGNOSIS — F1010 Alcohol abuse, uncomplicated: Secondary | ICD-10-CM

## 2010-12-17 MED ORDER — CITALOPRAM HYDROBROMIDE 20 MG PO TABS: 20.0000 mg | ORAL_TABLET | Freq: Every day | ORAL | Status: DC

## 2010-12-17 NOTE — Progress Notes (Signed)
Addended by: Stacie Glaze MD E on: 12/17/2010 04:19 PM   Modules accepted: Orders

## 2010-12-22 ENCOUNTER — Ambulatory Visit (INDEPENDENT_AMBULATORY_CARE_PROVIDER_SITE_OTHER): Payer: BC Managed Care – PPO | Admitting: Internal Medicine

## 2010-12-22 VITALS — BP 140/70 | HR 64 | Temp 98.2°F | Resp 16 | Ht 75.0 in | Wt 208.0 lb

## 2010-12-22 DIAGNOSIS — F411 Generalized anxiety disorder: Secondary | ICD-10-CM

## 2010-12-22 DIAGNOSIS — B002 Herpesviral gingivostomatitis and pharyngotonsillitis: Secondary | ICD-10-CM

## 2010-12-22 DIAGNOSIS — T887XXA Unspecified adverse effect of drug or medicament, initial encounter: Secondary | ICD-10-CM

## 2010-12-30 LAB — COMPREHENSIVE METABOLIC PANEL
Alkaline Phosphatase: 52
BUN: 15
CO2: 25
Calcium: 9.3
GFR calc non Af Amer: >60
Glucose, Bld: 102 — ABNORMAL HIGH
Total Protein: 7.4

## 2010-12-30 LAB — URINALYSIS, ROUTINE W REFLEX MICROSCOPIC
Bilirubin Urine: NEGATIVE
Hgb urine dipstick: NEGATIVE
Nitrite: NEGATIVE
Protein, ur: NEGATIVE
Specific Gravity, Urine: 1.022
Urobilinogen, UA: 0.2

## 2010-12-30 LAB — DIFFERENTIAL
Basophils Relative: 1
Eosinophils Absolute: 0
Lymphs Abs: 0.9
Monocytes Relative: 4
Neutro Abs: 6.9
Neutrophils Relative %: 85 — ABNORMAL HIGH

## 2010-12-30 LAB — APTT: aPTT: 34

## 2010-12-30 LAB — CBC
HCT: 46.2
Hemoglobin: 15.7
MCHC: 34.1
RBC: 4.99
RDW: 13.2

## 2010-12-30 LAB — TYPE AND SCREEN: Antibody Screen: NEGATIVE

## 2010-12-30 LAB — PROTIME-INR
INR: 1
Prothrombin Time: 13.8

## 2011-01-14 ENCOUNTER — Ambulatory Visit (INDEPENDENT_AMBULATORY_CARE_PROVIDER_SITE_OTHER): Payer: Self-pay | Admitting: Psychology

## 2011-01-14 DIAGNOSIS — F1010 Alcohol abuse, uncomplicated: Secondary | ICD-10-CM

## 2011-01-14 DIAGNOSIS — F331 Major depressive disorder, recurrent, moderate: Secondary | ICD-10-CM

## 2011-01-27 ENCOUNTER — Encounter: Payer: Self-pay | Admitting: Internal Medicine

## 2011-01-27 ENCOUNTER — Ambulatory Visit (INDEPENDENT_AMBULATORY_CARE_PROVIDER_SITE_OTHER): Payer: BC Managed Care – PPO | Admitting: Internal Medicine

## 2011-01-27 VITALS — BP 132/82 | HR 72 | Temp 98.2°F | Resp 16 | Ht 75.5 in | Wt 204.0 lb

## 2011-01-27 DIAGNOSIS — R4589 Other symptoms and signs involving emotional state: Secondary | ICD-10-CM

## 2011-01-27 DIAGNOSIS — Z9189 Other specified personal risk factors, not elsewhere classified: Secondary | ICD-10-CM

## 2011-01-27 DIAGNOSIS — F410 Panic disorder [episodic paroxysmal anxiety] without agoraphobia: Secondary | ICD-10-CM

## 2011-01-27 DIAGNOSIS — Z202 Contact with and (suspected) exposure to infections with a predominantly sexual mode of transmission: Secondary | ICD-10-CM

## 2011-01-27 DIAGNOSIS — K1379 Other lesions of oral mucosa: Secondary | ICD-10-CM

## 2011-01-27 DIAGNOSIS — K1370 Unspecified lesions of oral mucosa: Secondary | ICD-10-CM

## 2011-01-27 DIAGNOSIS — Z20828 Contact with and (suspected) exposure to other viral communicable diseases: Secondary | ICD-10-CM

## 2011-01-27 MED ORDER — FIRST-DUKES MOUTHWASH MT SUSP: 10.0000 mL | Freq: Four times a day (QID) | OROMUCOSAL | Status: DC

## 2011-01-27 MED ORDER — ALPRAZOLAM 0.5 MG PO TABS: 0.5000 mg | ORAL_TABLET | Freq: Three times a day (TID) | ORAL | Status: DC | PRN

## 2011-01-27 NOTE — Patient Instructions (Signed)
The patient is instructed to continue all medications as prescribed. Schedule followup with check out clerk upon leaving the clinic  

## 2011-01-28 NOTE — Progress Notes (Signed)
  Subjective:    Patient ID: Curtis Hendrix, male    DOB: [birth year], 50 y.o.   MRN: [id number]  HPI  Patient is a 50 year old white male who presents for bilateral screening having had an exposure approximately 3 months ago and he presents for his second test to confirm the disease.  He also has a herpetic lesion in his mouth that wasn't represent a new herpes simplex infection 2 months ago he has been on Valtrex suppression and presents for reexamination.    Review of Systems  Constitutional: Negative for fever and fatigue.  HENT: Negative for hearing loss, congestion, neck pain and postnasal drip.   Eyes: Negative for discharge, redness and visual disturbance.  Respiratory: Negative for cough, shortness of breath and wheezing.   Cardiovascular: Negative for leg swelling.  Gastrointestinal: Negative for abdominal pain, constipation and abdominal distention.  Genitourinary: Negative for urgency and frequency.  Musculoskeletal: Negative for joint swelling and arthralgias.  Skin: Negative for color change and rash.  Neurological: Negative for weakness and light-headedness.  Hematological: Negative for adenopathy.  Psychiatric/Behavioral: Negative for behavioral problems.   Past Medical History  Diagnosis Date  . Hypertension   . Radiculopathy, lumbar region   . Arthritis    Past Surgical History  Procedure Date  . L4-5   . Lumbar lamninectomy   . Lumbar fusion     dr Channing Mutters    reports that he has never smoked. He does not have any smokeless tobacco history on file. He reports that he drinks alcohol. He reports that he does not use illicit drugs. family history includes Kidney cancer in an unspecified family member. No Known Allergies     Objective:   Physical Exam  Constitutional: He appears well-developed and well-nourished.  HENT:  Head: Normocephalic and atraumatic.       Oropharynx shows some erythematous in the posterior crevices of the tonsils but no tonsillitis no  lesions no inflammation  Eyes: Conjunctivae are normal. Pupils are equal, round, and reactive to light.  Neck: Normal range of motion. Neck supple.  Cardiovascular: Normal rate and regular rhythm.   Pulmonary/Chest: Effort normal and breath sounds normal.  Abdominal: Soft. Bowel sounds are normal.  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  Viral exposure history and recheck HIV today that will be the second HIV test if negative we will reassure the patient of the high probability that he did not contact disease.  I have spent more than 30 minutes examining this patient face-to-face of which over half was spent in counseling

## 2011-02-02 ENCOUNTER — Ambulatory Visit (INDEPENDENT_AMBULATORY_CARE_PROVIDER_SITE_OTHER): Payer: Self-pay | Admitting: Psychology

## 2011-02-02 DIAGNOSIS — F331 Major depressive disorder, recurrent, moderate: Secondary | ICD-10-CM

## 2011-02-02 DIAGNOSIS — F1010 Alcohol abuse, uncomplicated: Secondary | ICD-10-CM

## 2011-03-10 ENCOUNTER — Other Ambulatory Visit: Payer: Self-pay | Admitting: Internal Medicine

## 2011-03-18 ENCOUNTER — Encounter: Payer: Self-pay | Admitting: Internal Medicine

## 2011-03-18 NOTE — Progress Notes (Signed)
Subjective:    Patient ID: Curtis Hendrix, male    DOB: [birth year], 51 y.o.   MRN: [id number]  HPI  Aloysuis Ribaudo presents today for discussion of medication management for anxiety he is on Xanax and Celexa.  We also discussed chronic prophylaxis for oral herpes lesion.    Review of Systems  Constitutional: Negative for fever and fatigue.  HENT: Negative for hearing loss, congestion, neck pain and postnasal drip.   Eyes: Negative for discharge, redness and visual disturbance.  Respiratory: Negative for cough, shortness of breath and wheezing.   Cardiovascular: Negative for leg swelling.  Gastrointestinal: Negative for abdominal pain, constipation and abdominal distention.  Genitourinary: Negative for urgency and frequency.  Musculoskeletal: Negative for joint swelling and arthralgias.  Skin: Negative for color change and rash.  Neurological: Negative for weakness and light-headedness.  Hematological: Negative for adenopathy.  Psychiatric/Behavioral: Negative for behavioral problems.   Past Medical History  Diagnosis Date  . Hypertension   . Radiculopathy, lumbar region   . Arthritis     History   Social History  . Marital Status: Married    Spouse Name: N/A    Number of Children: N/A  . Years of Education: N/A   Occupational History  . [employer]    Social History Main Topics  . Smoking status: Never Smoker   . Smokeless tobacco: Not on file  . Alcohol Use: Yes  . Drug Use: No  . Sexually Active: Yes   Other Topics Concern  . Not on file   Social History Narrative  . No narrative on file    Past Surgical History  Procedure Date  . L4-5   . Lumbar lamninectomy   . Lumbar fusion     dr Channing Mutters    Family History  Problem Relation Age of Onset  . Kidney cancer      No Known Allergies  Current Outpatient Prescriptions on File Prior to Visit  Medication Sig Dispense Refill  . KRILL OIL 1000 MG CAPS Take 2 capsules by mouth 2 (two) times daily.        .  multivitamin (ONE-A-DAY MEN'S) TABS Take 1 tablet by mouth daily.        . valACYclovir (VALTREX) 500 MG tablet Take 1 tablet (500 mg total) by mouth 2 (two) times daily.  60 tablet  3  . zolpidem (AMBIEN) 10 MG tablet Take 1 tablet (10 mg total) by mouth at bedtime as needed.  30 tablet  5  . Diphenhyd-Hydrocort-Nystatin (FIRST-DUKES MOUTHWASH) SUSP Use as directed 10 mLs in the mouth or throat 4 (four) times daily.  500 mL  2    BP 140/70  Pulse 64  Temp 98.2 F (36.8 C)  Resp 16  Ht 6\' 3"  (1.905 m)  Wt 208 lb (94.348 kg)  BMI 26.00 kg/m2        Objective:   Physical Exam  Nursing note and vitals reviewed. Constitutional: He appears well-developed and well-nourished.  HENT:  Head: Normocephalic and atraumatic.  Eyes: Conjunctivae are normal. Pupils are equal, round, and reactive to light.  Neck: Normal range of motion. Neck supple.  Cardiovascular: Normal rate and regular rhythm.   Pulmonary/Chest: Effort normal and breath sounds normal.  Abdominal: Soft. Bowel sounds are normal.          Assessment & Plan:  Review of current medications Celexa alprazolam for her situational anxiety with moderate depression patient is doing well on these medications he is seeing a counselor we we emphasized  the need to continue seeing the counselor on a regular basis for at least 6 months.  The patient will continue valacyclovir 5 mg daily as HSV prophylaxis

## 2011-03-18 NOTE — Patient Instructions (Signed)
The patient is instructed to continue all medications as prescribed. Schedule followup with check out clerk upon leaving the clinic  

## 2011-04-08 ENCOUNTER — Other Ambulatory Visit: Payer: Self-pay | Admitting: *Deleted

## 2011-04-08 MED ORDER — VALACYCLOVIR HCL 500 MG PO TABS: 500.0000 mg | ORAL_TABLET | Freq: Two times a day (BID) | ORAL | Status: DC

## 2011-06-12 ENCOUNTER — Other Ambulatory Visit: Payer: Self-pay | Admitting: Internal Medicine

## 2011-07-08 ENCOUNTER — Other Ambulatory Visit: Payer: Self-pay | Admitting: *Deleted

## 2011-07-08 DIAGNOSIS — F410 Panic disorder [episodic paroxysmal anxiety] without agoraphobia: Secondary | ICD-10-CM

## 2011-07-08 MED ORDER — ZOLPIDEM TARTRATE 10 MG PO TABS: 10.0000 mg | ORAL_TABLET | Freq: Every evening | ORAL | Status: DC | PRN

## 2011-07-08 MED ORDER — ALPRAZOLAM 0.5 MG PO TABS: 0.5000 mg | ORAL_TABLET | Freq: Three times a day (TID) | ORAL | Status: DC | PRN

## 2011-08-17 ENCOUNTER — Other Ambulatory Visit: Payer: Self-pay | Admitting: Internal Medicine

## 2011-09-26 ENCOUNTER — Other Ambulatory Visit: Payer: Self-pay | Admitting: Internal Medicine

## 2011-11-25 ENCOUNTER — Ambulatory Visit: Payer: BC Managed Care – PPO | Admitting: Family

## 2011-11-25 ENCOUNTER — Other Ambulatory Visit: Payer: Self-pay | Admitting: *Deleted

## 2011-11-25 MED ORDER — ZOLPIDEM TARTRATE 10 MG PO TABS: 10.0000 mg | ORAL_TABLET | Freq: Every evening | ORAL | Status: DC | PRN

## 2011-12-18 ENCOUNTER — Other Ambulatory Visit (INDEPENDENT_AMBULATORY_CARE_PROVIDER_SITE_OTHER): Payer: BC Managed Care – PPO

## 2011-12-18 DIAGNOSIS — Z0000 Encounter for general adult medical examination without abnormal findings: Secondary | ICD-10-CM

## 2011-12-18 LAB — HEPATIC FUNCTION PANEL
ALT: 40 U/L (ref 0–53)
AST: 32 U/L (ref 0–37)
Albumin: 4.1 g/dL (ref 3.5–5.2)
Alkaline Phosphatase: 43 U/L (ref 39–117)
Total Bilirubin: 1.3 mg/dL — ABNORMAL HIGH (ref 0.3–1.2)

## 2011-12-18 LAB — BASIC METABOLIC PANEL
Calcium: 9.3 mg/dL (ref 8.4–10.5)
GFR: 77.02 mL/min (ref >60.00)
Glucose, Bld: 78 mg/dL (ref 70–99)
Potassium: 5 mEq/L (ref 3.5–5.1)
Sodium: 136 mEq/L (ref 135–145)

## 2011-12-18 LAB — LIPID PANEL
Cholesterol: 183 mg/dL (ref 0–200)
HDL: 55.2 mg/dL (ref >39.00)
VLDL: 9 mg/dL (ref 0.0–40.0)

## 2011-12-18 LAB — CBC WITH DIFFERENTIAL/PLATELET
Basophils Absolute: 0 10*3/uL (ref 0.0–0.1)
Eosinophils Absolute: 0.1 10*3/uL (ref 0.0–0.7)
HCT: 43.8 % (ref 39.0–52.0)
Lymphs Abs: 1.2 10*3/uL (ref 0.7–4.0)
MCHC: 33.5 g/dL (ref 30.0–36.0)
MCV: 95.9 fl (ref 78.0–100.0)
Monocytes Absolute: 0.4 10*3/uL (ref 0.1–1.0)
Neutrophils Relative %: 54.7 % (ref 43.0–77.0)
Platelets: 177 10*3/uL (ref 150.0–400.0)
RDW: 13.7 % (ref 11.5–14.6)
WBC: 3.8 10*3/uL — ABNORMAL LOW (ref 4.5–10.5)

## 2011-12-18 LAB — POCT URINALYSIS DIPSTICK
Bilirubin, UA: NEGATIVE
Ketones, UA: NEGATIVE
Leukocytes, UA: NEGATIVE
Spec Grav, UA: 1.015
pH, UA: 6.5

## 2011-12-21 ENCOUNTER — Other Ambulatory Visit: Payer: BC Managed Care – PPO

## 2011-12-28 ENCOUNTER — Encounter: Payer: Self-pay | Admitting: Internal Medicine

## 2011-12-28 ENCOUNTER — Ambulatory Visit (INDEPENDENT_AMBULATORY_CARE_PROVIDER_SITE_OTHER): Payer: BC Managed Care – PPO | Admitting: Internal Medicine

## 2011-12-28 ENCOUNTER — Other Ambulatory Visit: Payer: Self-pay | Admitting: *Deleted

## 2011-12-28 VITALS — BP 130/80 | HR 72 | Temp 98.6°F | Resp 16 | Ht 75.0 in | Wt 216.0 lb

## 2011-12-28 DIAGNOSIS — F419 Anxiety disorder, unspecified: Secondary | ICD-10-CM

## 2011-12-28 DIAGNOSIS — G4700 Insomnia, unspecified: Secondary | ICD-10-CM

## 2011-12-28 DIAGNOSIS — Z0000 Encounter for general adult medical examination without abnormal findings: Secondary | ICD-10-CM

## 2011-12-28 DIAGNOSIS — F411 Generalized anxiety disorder: Secondary | ICD-10-CM

## 2011-12-28 DIAGNOSIS — F410 Panic disorder [episodic paroxysmal anxiety] without agoraphobia: Secondary | ICD-10-CM

## 2011-12-28 DIAGNOSIS — B009 Herpesviral infection, unspecified: Secondary | ICD-10-CM

## 2011-12-28 DIAGNOSIS — Z23 Encounter for immunization: Secondary | ICD-10-CM

## 2011-12-28 DIAGNOSIS — R4589 Other symptoms and signs involving emotional state: Secondary | ICD-10-CM

## 2011-12-28 MED ORDER — ZOLPIDEM TARTRATE 10 MG PO TABS: 10.0000 mg | ORAL_TABLET | Freq: Every evening | ORAL | Status: DC | PRN

## 2011-12-28 MED ORDER — ALPRAZOLAM 0.5 MG PO TABS: 0.5000 mg | ORAL_TABLET | Freq: Three times a day (TID) | ORAL | Status: DC | PRN

## 2011-12-28 MED ORDER — BUPROPION HCL ER (XL) 300 MG PO TB24: 300.0000 mg | ORAL_TABLET | Freq: Every day | ORAL | Status: DC

## 2011-12-28 MED ORDER — VALACYCLOVIR HCL 500 MG PO TABS: 500.0000 mg | ORAL_TABLET | Freq: Two times a day (BID) | ORAL | Status: DC

## 2011-12-28 MED ORDER — CITALOPRAM HYDROBROMIDE 20 MG PO TABS: 20.0000 mg | ORAL_TABLET | Freq: Every day | ORAL | Status: DC

## 2011-12-28 NOTE — Progress Notes (Signed)
Subjective:    Patient ID: Curtis Hendrix, male    DOB: [birth year], 51 y.o.   MRN: [id number]  HPI Patient is a 51 year old male who presents for his yearly examination also to talk about his chronic anxiety and some situation related to depression for which she takes Wellbutrin and citalopram. He has been on stable medical management for  about 1.5 years and we discussed length of therapy as well as the use of medications for mild to moderate insomnia associated with this.  He is followed for controlled hyperlipidemia controlled hypertension he takes Valtrex for HSV prophylaxis   Review of Systems  Constitutional: Negative for fever and fatigue.  HENT: Negative for hearing loss, congestion, neck pain and postnasal drip.   Eyes: Negative for discharge, redness and visual disturbance.  Respiratory: Negative for cough, shortness of breath and wheezing.   Cardiovascular: Negative for leg swelling.  Gastrointestinal: Negative for abdominal pain, constipation and abdominal distention.  Genitourinary: Negative for urgency and frequency.  Musculoskeletal: Negative for joint swelling and arthralgias.  Skin: Negative for color change and rash.  Neurological: Negative for weakness and light-headedness.  Hematological: Negative for adenopathy.  Psychiatric/Behavioral: Negative for behavioral problems.   Past Medical History  Diagnosis Date  . Hypertension   . Radiculopathy, lumbar region   . Arthritis     History   Social History  . Marital Status: Married    Spouse Name: N/A    Number of Children: N/A  . Years of Education: N/A   Occupational History  . [employer]    Social History Main Topics  . Smoking status: Never Smoker   . Smokeless tobacco: Not on file  . Alcohol Use: Yes  . Drug Use: No  . Sexually Active: Yes   Other Topics Concern  . Not on file   Social History Narrative  . No narrative on file    Past Surgical History  Procedure Date  . L4-5   . Lumbar  lamninectomy   . Lumbar fusion     dr Channing Mutters    Family History  Problem Relation Age of Onset  . Kidney cancer      No Known Allergies  Current Outpatient Prescriptions on File Prior to Visit  Medication Sig Dispense Refill  . Diphenhyd-Hydrocort-Nystatin (FIRST-DUKES MOUTHWASH) SUSP Use as directed 10 mLs in the mouth or throat 4 (four) times daily.  500 mL  2  . KRILL OIL 1000 MG CAPS Take 2 capsules by mouth 2 (two) times daily.        . multivitamin (ONE-A-DAY MEN'S) TABS Take 1 tablet by mouth daily.        Marland Kitchen DISCONTD: citalopram (CELEXA) 20 MG tablet TAKE 1 TABLET BY MOUTH EVERY DAY  30 tablet  2  . DISCONTD: zolpidem (AMBIEN) 10 MG tablet Take 1 tablet (10 mg total) by mouth at bedtime as needed.  30 tablet  3  . buPROPion (WELLBUTRIN XL) 300 MG 24 hr tablet Take 1 tablet (300 mg total) by mouth daily.  30 tablet  11    BP 130/80  Pulse 72  Temp 98.6 F (37 C)  Resp 16  Ht 6\' 3"  (1.905 m)  Wt 216 lb (97.977 kg)  BMI 27.00 kg/m2        Objective:   Physical Exam  Constitutional: He is oriented to person, place, and time. He appears well-developed and well-nourished.  HENT:  Head: Normocephalic and atraumatic.  Eyes: Conjunctivae normal are normal. Pupils are equal, round, and  reactive to light.  Neck: Normal range of motion. Neck supple.  Cardiovascular: Normal rate and regular rhythm.   Pulmonary/Chest: Effort normal and breath sounds normal.  Abdominal: Soft. Bowel sounds are normal.  Genitourinary: Rectum normal and prostate normal.  Musculoskeletal: Normal range of motion.  Neurological: He is alert and oriented to person, place, and time.  Skin: Skin is warm and dry.  Psychiatric: He has a normal mood and affect. His behavior is normal.          Assessment & Plan:   Patient presents for yearly preventative medicine examination.   all immunizations and health maintenance protocols were reviewed with the patient and they are up to date with these  protocols.   screening laboratory values were reviewed with the patient including screening of hyperlipidemia PSA renal function and hepatic function.   There medications past medical history social history problem list and allergies were reviewed in detail.   Goals were established with regard to weight loss exercise diet in compliance with medications   Continue current medications for anxiety and depression refills sent to his pharmacy.  Continue Valtrex 500 mg by mouth daily for HSV prophylaxis.  Blood pressure stable on current medications lipid stable

## 2011-12-28 NOTE — Patient Instructions (Addendum)
The patient is instructed to continue all medications as prescribed. Schedule followup with check out clerk upon leaving the clinic  

## 2012-01-08 ENCOUNTER — Other Ambulatory Visit: Payer: Self-pay | Admitting: Internal Medicine

## 2012-01-08 NOTE — Telephone Encounter (Signed)
This was given in 3 refills on 12-28-11

## 2012-01-08 NOTE — Telephone Encounter (Signed)
Pt. Requesting refill on zolpidem. [facility].

## 2012-04-06 ENCOUNTER — Telehealth: Payer: Self-pay | Admitting: Internal Medicine

## 2012-04-06 DIAGNOSIS — F410 Panic disorder [episodic paroxysmal anxiety] without agoraphobia: Secondary | ICD-10-CM

## 2012-04-06 MED ORDER — ALPRAZOLAM 0.5 MG PO TABS: 0.5000 mg | ORAL_TABLET | Freq: Three times a day (TID) | ORAL | Status: DC | PRN

## 2012-04-06 NOTE — Telephone Encounter (Signed)
Pt needs refill of  ALPRAZolam (XANAX) 0.5 MG tablet zolpidem (AMBIEN) 10 MG tablet Pharm: [facility]

## 2012-05-10 ENCOUNTER — Other Ambulatory Visit: Payer: Self-pay | Admitting: Internal Medicine

## 2012-07-19 ENCOUNTER — Other Ambulatory Visit: Payer: Self-pay | Admitting: Internal Medicine

## 2012-08-23 ENCOUNTER — Other Ambulatory Visit: Payer: Self-pay | Admitting: Internal Medicine

## 2012-09-11 ENCOUNTER — Other Ambulatory Visit: Payer: Self-pay | Admitting: Internal Medicine

## 2012-09-19 ENCOUNTER — Other Ambulatory Visit: Payer: Self-pay | Admitting: Internal Medicine

## 2012-10-27 ENCOUNTER — Other Ambulatory Visit: Payer: Self-pay | Admitting: Internal Medicine

## 2012-12-07 ENCOUNTER — Other Ambulatory Visit: Payer: Self-pay | Admitting: Internal Medicine

## 2012-12-07 ENCOUNTER — Telehealth: Payer: Self-pay | Admitting: Internal Medicine

## 2012-12-07 DIAGNOSIS — Z1211 Encounter for screening for malignant neoplasm of colon: Secondary | ICD-10-CM

## 2012-12-07 NOTE — Telephone Encounter (Signed)
Pt assistant called today to schedule pt's CPX. She stated that because the physical couldn't be completed until February, the pt would like to complete his colonoscopy in advance. Please place orders. Thank you!

## 2012-12-07 NOTE — Telephone Encounter (Signed)
done

## 2012-12-23 ENCOUNTER — Encounter: Payer: Self-pay | Admitting: Internal Medicine

## 2013-01-24 ENCOUNTER — Other Ambulatory Visit: Payer: Self-pay | Admitting: Internal Medicine

## 2013-02-06 ENCOUNTER — Ambulatory Visit (AMBULATORY_SURGERY_CENTER): Payer: Self-pay

## 2013-02-06 VITALS — Ht 75.0 in | Wt 205.0 lb

## 2013-02-06 DIAGNOSIS — Z1211 Encounter for screening for malignant neoplasm of colon: Secondary | ICD-10-CM

## 2013-02-06 MED ORDER — SUPREP BOWEL PREP KIT 17.5-3.13-1.6 GM/177ML PO SOLN: 1.0000 | Freq: Once | ORAL | Status: DC

## 2013-02-13 ENCOUNTER — Encounter: Payer: Self-pay | Admitting: Internal Medicine

## 2013-02-23 ENCOUNTER — Ambulatory Visit (AMBULATORY_SURGERY_CENTER): Payer: PRIVATE HEALTH INSURANCE | Admitting: Internal Medicine

## 2013-02-23 ENCOUNTER — Encounter: Payer: Self-pay | Admitting: Internal Medicine

## 2013-02-23 VITALS — BP 124/69 | HR 51 | Temp 98.1°F | Resp 23 | Ht 75.0 in | Wt 205.0 lb

## 2013-02-23 DIAGNOSIS — K5730 Diverticulosis of large intestine without perforation or abscess without bleeding: Secondary | ICD-10-CM

## 2013-02-23 DIAGNOSIS — Z860100 Personal history of colon polyps, unspecified: Secondary | ICD-10-CM

## 2013-02-23 DIAGNOSIS — Z1211 Encounter for screening for malignant neoplasm of colon: Secondary | ICD-10-CM

## 2013-02-23 DIAGNOSIS — D126 Benign neoplasm of colon, unspecified: Secondary | ICD-10-CM

## 2013-02-23 HISTORY — DX: Personal history of colonic polyps: Z86.010

## 2013-02-23 HISTORY — DX: Personal history of colon polyps, unspecified: Z86.0100

## 2013-02-23 MED ORDER — SODIUM CHLORIDE 0.9 % IV SOLN: 500.0000 mL | INTRAVENOUS | Status: DC

## 2013-02-23 NOTE — Progress Notes (Signed)
Called to room to assist during endoscopic procedure.  Patient ID and intended procedure confirmed with present staff. Received instructions for my participation in the procedure from the performing physician.  

## 2013-02-23 NOTE — Progress Notes (Signed)
Pt left his cell phone at [facility].  I tried several times to reach his wife (their home phone is on file but no cell phone for her)  Phone locked in drawer in the RR until time when pt picks up

## 2013-02-23 NOTE — Progress Notes (Signed)
Patient did not experience any of the following events: a burn prior to discharge; a fall within the facility; wrong site/side/patient/procedure/implant event; or a hospital transfer or hospital admission upon discharge from the facility. (G8907) Patient did not have preoperative order for IV antibiotic SSI prophylaxis. (G8918)  

## 2013-02-23 NOTE — Progress Notes (Signed)
Report to pacu rn, vss, bbs=clear 

## 2013-02-23 NOTE — Patient Instructions (Addendum)
I found and removed 6 polyps - 5 were recovered, 1 2-3 mm polyp was not picked up. They all look benign. You also have a condition called diverticulosis - common and not usually a problem. Please read the handout provided.  I will let you know pathology results and when to have another routine colonoscopy by mail.  It is the time of year to have a vaccination to prevent the flu (influenza virus). Please have this done through your primary care provider or you can get this done at local pharmacies or the [facility]. It would be very helpful if you notify your primary care provider when and where you had the vaccination given by messaging them in My Chart, leaving a message or faxing the information.  I appreciate the opportunity to care for you. Iva Boop, MD, FACG  YOU HAD AN ENDOSCOPIC PROCEDURE TODAY AT THE [facility]: Refer to the procedure report that was given to you for any specific questions about what was found during the examination.  If the procedure report does not answer your questions, please call your gastroenterologist to clarify.  If you requested that your care partner not be given the details of your procedure findings, then the procedure report has been included in a sealed envelope for you to review at your convenience later.  YOU SHOULD EXPECT: Some feelings of bloating in the abdomen. Passage of more gas than usual.  Walking can help get rid of the air that was put into your GI tract during the procedure and reduce the bloating. If you had a lower endoscopy (such as a colonoscopy or flexible sigmoidoscopy) you may notice spotting of blood in your stool or on the toilet paper. If you underwent a bowel prep for your procedure, then you may not have a normal bowel movement for a few days.  DIET: Your first meal following the procedure should be a light meal and then it is ok to progress to your normal diet.  A half-sandwich or bowl of soup is an example of a  good first meal.  Heavy or fried foods are harder to digest and may make you feel nauseous or bloated.  Likewise meals heavy in dairy and vegetables can cause extra gas to form and this can also increase the bloating.  Drink plenty of fluids but you should avoid alcoholic beverages for 24 hours.  ACTIVITY: Your care partner should take you home directly after the procedure.  You should plan to take it easy, moving slowly for the rest of the day.  You can resume normal activity the day after the procedure however you should NOT DRIVE or use heavy machinery for 24 hours (because of the sedation medicines used during the test).    SYMPTOMS TO REPORT IMMEDIATELY: A gastroenterologist can be reached at any hour.  During normal business hours, 8:30 AM to 5:00 PM Monday through Friday, call [phone].  After hours and on weekends, please call the GI answering service at [phone] who will take a message and have the physician on call contact you.   Following lower endoscopy (colonoscopy or flexible sigmoidoscopy):  Excessive amounts of blood in the stool  Significant tenderness or worsening of abdominal pains  Swelling of the abdomen that is new, acute  Fever of 100F or higher  FOLLOW UP: If any biopsies were taken you will be contacted by phone or by letter within the next 1-3 weeks.  Call your gastroenterologist if you have not  heard about the biopsies in 3 weeks.  Our staff will call the home number listed on your records the next business day following your procedure to check on you and address any questions or concerns that you may have at that time regarding the information given to you following your procedure. This is a courtesy call and so if there is no answer at the home number and we have not heard from you through the emergency physician on call, we will assume that you have returned to your regular daily activities without incident.  SIGNATURES/CONFIDENTIALITY: You and/or your  care partner have signed paperwork which will be entered into your electronic medical record.  These signatures attest to the fact that that the information above on your After Visit Summary has been reviewed and is understood.  Full responsibility of the confidentiality of this discharge information lies with you and/or your care-partner.  Await pathology  Please read over handouts about polyps and diverticulosis  Continue your normal medications

## 2013-02-23 NOTE — Progress Notes (Signed)
Pt here to pick up cell phone

## 2013-02-24 ENCOUNTER — Telehealth: Payer: Self-pay | Admitting: *Deleted

## 2013-02-24 NOTE — Telephone Encounter (Signed)
No answer, left message to call if questions or concerns. 

## 2013-02-24 NOTE — Op Note (Signed)
[facility] [address] [place], [address]   COLONOSCOPY PROCEDURE REPORT  PATIENT: Curtis Hendrix, Curtis Hendrix  MR#: [id number] BIRTHDATE: Mar 10, 1961 , 52  yrs. old GENDER: Male ENDOSCOPIST: Iva Boop, MD, [facility] REFERRED XB:JYNW Carolynn Sayers, M.D. PROCEDURE DATE:  02/23/2013 PROCEDURE:   Colonoscopy with biopsy and snare polypectomy First Screening Colonoscopy - Avg.  risk and is 50 yrs.  old or older Yes.  Prior Negative Screening - Now for repeat screening. N/A  History of Adenoma - Now for follow-up colonoscopy & has been > or = to 3 yrs.  N/A  Polyps Removed Today? Yes. ASA CLASS:   Class II INDICATIONS:average risk screening and first colonoscopy. MEDICATIONS: propofol (Diprivan) 500mg  IV, MAC sedation, administered by CRNA, and These medications were titrated to patient response per physician's verbal order  DESCRIPTION OF PROCEDURE:   After the risks benefits and alternatives of the procedure were thoroughly explained, informed consent was obtained.  A digital rectal exam revealed no abnormalities of the rectum, A digital rectal exam revealed no prostatic nodules, and A digital rectal exam revealed the prostate was not enlarged.   The [id number]  endoscope was introduced through the anus and advanced to the cecum, which was identified by both the appendix and ileocecal valve. No adverse events experienced.   The quality of the prep was Suprep good  The instrument was then slowly withdrawn as the colon was fully examined.  COLON FINDINGS: Six small sessile polyps were found in the ascending colon, at the hepatic flexure, in the transverse colon, descending colon, and sigmoid colon.  A polypectomy was performed with a cold snare or with cold forceps.  The resection was complete and the polyp tissue was completely retrieved in 5 of 6.   Moderate diverticulosis was noted.   The colon mucosa was otherwise normal. Retroflexed views revealed no  abnormalities. The time to cecum=3 minutes 07 seconds.  Withdrawal time=17 minutes 27 seconds.  The scope was withdrawn and the procedure completed.  COMPLICATIONS: There were no complications.     ENDOSCOPIC IMPRESSION: 1.   Six small sessile polyps were found in the ascending colon, at the hepatic flexure, in the transverse colon, descending colon, and sigmoid colon; polypectomy was performed with a cold snare or with cold forceps 5 of 6 polyps recovered 2.   Moderate diverticulosis was noted 3.   The colon mucosa was otherwise normal  RECOMMENDATIONS: Timing of repeat colonoscopy will be determined by pathology findings.   eSigned:  Iva Boop, MD, [facility] 02/23/2013 2:08 PM   cc: Stacie Glaze, MD and The Patient   PATIENT NAME:  Curtis Hendrix, Curtis Hendrix MR#: [id number]

## 2013-02-28 ENCOUNTER — Other Ambulatory Visit: Payer: Self-pay | Admitting: Internal Medicine

## 2013-02-28 ENCOUNTER — Encounter: Payer: Self-pay | Admitting: Internal Medicine

## 2013-02-28 NOTE — Progress Notes (Signed)
Quick Note:  5 adenomas - repeat colonoscopy 02/2016 approx. ______

## 2013-03-01 ENCOUNTER — Telehealth: Payer: Self-pay | Admitting: Internal Medicine

## 2013-03-01 NOTE — Telephone Encounter (Signed)
LVOM FOR PT RETURN CALL IN RE TO REFERRAL .

## 2013-04-16 DIAGNOSIS — S2232XA Fracture of one rib, left side, initial encounter for closed fracture: Secondary | ICD-10-CM

## 2013-04-16 HISTORY — DX: Fracture of one rib, left side, initial encounter for closed fracture: S22.32XA

## 2013-05-05 ENCOUNTER — Other Ambulatory Visit (INDEPENDENT_AMBULATORY_CARE_PROVIDER_SITE_OTHER): Payer: PRIVATE HEALTH INSURANCE

## 2013-05-05 DIAGNOSIS — Z0000 Encounter for general adult medical examination without abnormal findings: Secondary | ICD-10-CM

## 2013-05-05 DIAGNOSIS — E785 Hyperlipidemia, unspecified: Secondary | ICD-10-CM

## 2013-05-05 LAB — CBC WITH DIFFERENTIAL/PLATELET
BASOS ABS: 0 10*3/uL (ref 0.0–0.1)
Basophils Relative: 0.6 % (ref 0.0–3.0)
EOS ABS: 0.1 10*3/uL (ref 0.0–0.7)
Eosinophils Relative: 2 % (ref 0.0–5.0)
HCT: 45.6 % (ref 39.0–52.0)
Hemoglobin: 15.3 g/dL (ref 13.0–17.0)
LYMPHS PCT: 31.1 % (ref 12.0–46.0)
Lymphs Abs: 1.6 10*3/uL (ref 0.7–4.0)
MCHC: 33.6 g/dL (ref 30.0–36.0)
MCV: 99.3 fl (ref 78.0–100.0)
Monocytes Absolute: 0.4 10*3/uL (ref 0.1–1.0)
Monocytes Relative: 8.7 % (ref 3.0–12.0)
Neutro Abs: 2.9 10*3/uL (ref 1.4–7.7)
Neutrophils Relative %: 57.6 % (ref 43.0–77.0)
Platelets: 221 10*3/uL (ref 150.0–400.0)
RBC: 4.59 Mil/uL (ref 4.22–5.81)
RDW: 13.4 % (ref 11.5–14.6)
WBC: 5.1 10*3/uL (ref 4.5–10.5)

## 2013-05-05 LAB — BASIC METABOLIC PANEL
BUN: 16 mg/dL (ref 6–23)
CALCIUM: 9.4 mg/dL (ref 8.4–10.5)
CO2: 23 mEq/L (ref 19–32)
CREATININE: 0.9 mg/dL (ref 0.4–1.5)
Chloride: 104 mEq/L (ref 96–112)
GFR: 97.02 mL/min (ref >60.00)
Glucose, Bld: 84 mg/dL (ref 70–99)
Potassium: 4.1 mEq/L (ref 3.5–5.1)
Sodium: 137 mEq/L (ref 135–145)

## 2013-05-05 LAB — HEPATIC FUNCTION PANEL
ALBUMIN: 4.4 g/dL (ref 3.5–5.2)
ALK PHOS: 52 U/L (ref 39–117)
ALT: 36 U/L (ref 0–53)
AST: 29 U/L (ref 0–37)
Bilirubin, Direct: 0.1 mg/dL (ref 0.0–0.3)
Total Bilirubin: 0.9 mg/dL (ref 0.3–1.2)
Total Protein: 7 g/dL (ref 6.0–8.3)

## 2013-05-05 LAB — POCT URINALYSIS DIPSTICK
Bilirubin, UA: NEGATIVE
Blood, UA: NEGATIVE
Glucose, UA: NEGATIVE
Ketones, UA: NEGATIVE
LEUKOCYTES UA: NEGATIVE
NITRITE UA: NEGATIVE
PROTEIN UA: NEGATIVE
Spec Grav, UA: 1.02
UROBILINOGEN UA: 0.2
pH, UA: 7

## 2013-05-05 LAB — LIPID PANEL
CHOLESTEROL: 203 mg/dL — AB (ref 0–200)
HDL: 57.4 mg/dL (ref >39.00)
Total CHOL/HDL Ratio: 4
Triglycerides: 89 mg/dL (ref 0.0–149.0)
VLDL: 17.8 mg/dL (ref 0.0–40.0)

## 2013-05-05 LAB — TSH: TSH: 1.44 u[IU]/mL (ref 0.35–5.50)

## 2013-05-05 LAB — PSA: PSA: 0.45 ng/mL (ref 0.10–4.00)

## 2013-05-08 ENCOUNTER — Other Ambulatory Visit: Payer: Self-pay | Admitting: Internal Medicine

## 2013-05-08 LAB — LDL CHOLESTEROL, DIRECT: Direct LDL: 132 mg/dL

## 2013-05-12 ENCOUNTER — Encounter: Payer: Self-pay | Admitting: Internal Medicine

## 2013-05-12 ENCOUNTER — Ambulatory Visit (INDEPENDENT_AMBULATORY_CARE_PROVIDER_SITE_OTHER): Payer: PRIVATE HEALTH INSURANCE | Admitting: Internal Medicine

## 2013-05-12 VITALS — BP 150/100 | HR 68 | Temp 98.2°F | Resp 16 | Ht 75.0 in | Wt 214.0 lb

## 2013-05-12 DIAGNOSIS — Z0000 Encounter for general adult medical examination without abnormal findings: Secondary | ICD-10-CM

## 2013-05-12 NOTE — Progress Notes (Signed)
Subjective:    Patient ID: Curtis Hendrix, male    DOB: 09/02/1960, 53 y.o.   MRN: [id number]  HPI CPX Due EKG Hx of CP, HTN and lipids Currently of diet control  Rib fracture  Left  Rib brace Cramer MRI on left shoulder      Review of Systems  Constitutional: Negative for fever and fatigue.  HENT: Negative for congestion, hearing loss and postnasal drip.   Eyes: Negative for discharge, redness and visual disturbance.  Respiratory: Negative for cough, shortness of breath and wheezing.   Cardiovascular: Negative for leg swelling.  Gastrointestinal: Negative for abdominal pain, constipation and abdominal distention.  Genitourinary: Negative for urgency and frequency.  Musculoskeletal: Negative for arthralgias, joint swelling and neck pain.  Skin: Negative for color change and rash.  Neurological: Negative for weakness and light-headedness.  Hematological: Negative for adenopathy.  Psychiatric/Behavioral: Negative for behavioral problems.       Past Medical History  Diagnosis Date  . Hypertension   . Radiculopathy, lumbar region   . Arthritis   . Personal history of colonic adenomas 02/23/2013    02/23/2013 6 small polyps removed, 5 recovered - all adenomas repeat colonoscopy 02/2016      History   Social History  . Marital Status: Married    Spouse Name: N/A    Number of Children: N/A  . Years of Education: N/A   Occupational History  . [employer]    Social History Main Topics  . Smoking status: Never Smoker   . Smokeless tobacco: Not on file  . Alcohol Use: No  . Drug Use: No  . Sexual Activity: Yes   Other Topics Concern  . Not on file   Social History Narrative  . No narrative on file    Past Surgical History  Procedure Laterality Date  . L4-5    . Lumbar lamninectomy    . Tonsillectomy    . Wisdom tooth extraction      Family History  Problem Relation Age of Onset  . Kidney cancer    . Colon cancer Neg Hx     No Known  Allergies  Current Outpatient Prescriptions on File Prior to Visit  Medication Sig Dispense Refill  . ALPRAZolam (XANAX) 0.5 MG tablet TAKE 1 TABLET BY MOUTH EVERY DAY  30 tablet  3  . buPROPion (WELLBUTRIN XL) 300 MG 24 hr tablet TAKE 1 TABLET (300 MG TOTAL) BY MOUTH DAILY.  30 tablet  10  . citalopram (CELEXA) 20 MG tablet TAKE 1 TABLET BY MOUTH EVERY DAY  30 tablet  5  . KRILL OIL 1000 MG CAPS Take 2 capsules by mouth 2 (two) times daily.        . multivitamin (ONE-A-DAY MEN'S) TABS Take 1 tablet by mouth daily.       . valACYclovir (VALTREX) 500 MG tablet TAKE 1 TABLET BY MOUTH TWICE A DAY  60 tablet  6  . zolpidem (AMBIEN) 10 MG tablet TAKE 1 TABLET AT BEDTIME  30 tablet  3   No current facility-administered medications on file prior to visit.        Objective:   Physical Exam  Nursing note and vitals reviewed. Constitutional: He is oriented to person, place, and time. He appears well-developed and well-nourished.  HENT:  Head: Normocephalic and atraumatic.  Eyes: Conjunctivae are normal. Pupils are equal, round, and reactive to light.  Neck: Normal range of motion. Neck supple.  Cardiovascular: Normal rate and regular rhythm.   Pulmonary/Chest:  Effort normal and breath sounds normal.  Abdominal: Soft. Bowel sounds are normal.  Genitourinary: Rectum normal and prostate normal.  Musculoskeletal: Normal range of motion.  Neurological: He is alert and oriented to person, place, and time.  Skin: Skin is warm and dry.  Psychiatric: He has a normal mood and affect. His behavior is normal.          Assessment & Plan:  Patient presents for yearly preventative medicine examination. Medicare questionnaire was completed  All immunizations and health maintenance protocols were reviewed with the patient and needed orders were placed.  Appropriate screening laboratory values were ordered for the patient including screening of hyperlipidemia, renal function and hepatic  function. If indicated by BPH, a PSA was ordered. Rib Fracrture Medication reconciliation,  past medical history, social history, problem list and allergies were reviewed in detail with the patient  Goals were established with regard to weight loss, exercise, and  diet in compliance with medications  End of life planning was discussed.

## 2013-05-12 NOTE — Patient Instructions (Signed)
The patient is instructed to continue all medications as prescribed. Schedule followup with check out clerk upon leaving the clinic  

## 2013-05-12 NOTE — Progress Notes (Signed)
Pre visit review using our clinic review tool, if applicable. No additional management support is needed unless otherwise documented below in the visit note. 

## 2013-06-08 ENCOUNTER — Encounter: Payer: Self-pay | Admitting: Internal Medicine

## 2013-08-11 ENCOUNTER — Other Ambulatory Visit: Payer: Self-pay | Admitting: Internal Medicine

## 2013-08-11 NOTE — Telephone Encounter (Signed)
Pt is going out of town and need to have these meds filled no later than Saturday 08/12/13

## 2013-08-16 ENCOUNTER — Telehealth: Payer: Self-pay | Admitting: Internal Medicine

## 2013-08-16 NOTE — Telephone Encounter (Signed)
[facility] Triage Call Report Triage Record Num: [id number] Operator: Sinda Du Patient Name: Curtis Hendrix Call Date & Time: 08/12/2013 11:12:38AM Patient Phone: [phone] PCP: Patient Gender: Male PCP Fax : Patient DOB: 1960/06/04 Practice Name: [facility] Reason for Call: Adonis Huguenin tech from [place] [phone]. Calling because Xanax and Citalopram were called in and left on voicemail no MD was left. Per EMR WC listed under DR Sherren Mocha. Protocol(s) Used: Medication Questions - Adult Recommended Outcome per Protocol: Provided Health Information Reason for Outcome: Caller has medication question(s) that was answered with available resources Care Advice: ~ 05/

## 2013-09-13 ENCOUNTER — Other Ambulatory Visit: Payer: Self-pay | Admitting: Internal Medicine

## 2013-10-30 ENCOUNTER — Encounter (HOSPITAL_COMMUNITY): Payer: Self-pay | Admitting: Emergency Medicine

## 2013-10-30 ENCOUNTER — Emergency Department (INDEPENDENT_AMBULATORY_CARE_PROVIDER_SITE_OTHER): Payer: PRIVATE HEALTH INSURANCE

## 2013-10-30 ENCOUNTER — Emergency Department (INDEPENDENT_AMBULATORY_CARE_PROVIDER_SITE_OTHER)
Admission: EM | Admit: 2013-10-30 | Discharge: 2013-10-30 | Disposition: A | Discharge status: Home / Self Care | Payer: PRIVATE HEALTH INSURANCE | Source: Home / Self Care | Attending: Family Medicine | Admitting: Family Medicine

## 2013-10-30 ENCOUNTER — Emergency Department (HOSPITAL_COMMUNITY): Payer: PRIVATE HEALTH INSURANCE

## 2013-10-30 DIAGNOSIS — L03019 Cellulitis of unspecified finger: Secondary | ICD-10-CM

## 2013-10-30 DIAGNOSIS — L03012 Cellulitis of left finger: Secondary | ICD-10-CM

## 2013-10-30 DIAGNOSIS — L02519 Cutaneous abscess of unspecified hand: Secondary | ICD-10-CM

## 2013-10-30 MED ORDER — SULFAMETHOXAZOLE-TRIMETHOPRIM 800-160 MG PO TABS: 2.0000 | ORAL_TABLET | Freq: Two times a day (BID) | ORAL | Status: DC

## 2013-10-30 MED ORDER — CEFUROXIME AXETIL 500 MG PO TABS: 500.0000 mg | ORAL_TABLET | Freq: Two times a day (BID) | ORAL | Status: DC

## 2013-10-30 NOTE — Discharge Instructions (Signed)
Thank you for coming in today. Take both bactrim and ceftin for infection.  If not better please come back.  If very much worse go to the ER.   Cellulitis Cellulitis is an infection of the skin and the tissue beneath it. The infected area is usually red and tender. Cellulitis occurs most often in the arms and lower legs.  CAUSES  Cellulitis is caused by bacteria that enter the skin through cracks or cuts in the skin. The most common types of bacteria that cause cellulitis are staphylococci and streptococci. SIGNS AND SYMPTOMS   Redness and warmth.  Swelling.  Tenderness or pain.  Fever. DIAGNOSIS  Your health care provider can usually determine what is wrong based on a physical exam. Blood tests may also be done. TREATMENT  Treatment usually involves taking an antibiotic medicine. HOME CARE INSTRUCTIONS   Take your antibiotic medicine as directed by your health care provider. Finish the antibiotic even if you start to feel better.  Keep the infected arm or leg elevated to reduce swelling.  Apply a warm cloth to the affected area up to 4 times per day to relieve pain.  Take medicines only as directed by your health care provider.  Keep all follow-up visits as directed by your health care provider. SEEK MEDICAL CARE IF:   You notice red streaks coming from the infected area.  Your red area gets larger or turns dark in color.  Your bone or joint underneath the infected area becomes painful after the skin has healed.  Your infection returns in the same area or another area.  You notice a swollen bump in the infected area.  You develop new symptoms.  You have a fever. SEEK IMMEDIATE MEDICAL CARE IF:   You feel very sleepy.  You develop vomiting or diarrhea.  You have a general ill feeling (malaise) with muscle aches and pains. MAKE SURE YOU:   Understand these instructions.  Will watch your condition.  Will get help right away if you are not doing well or get  worse. Document Released: 12/10/2004 Document Revised: 07/17/2013 Document Reviewed: 05/18/2011 [facility] Patient Information 2015 [place], [place]. This information is not intended to replace advice given to you by your health care provider. Make sure you discuss any questions you have with your health care provider.

## 2013-10-30 NOTE — ED Provider Notes (Signed)
LEDARIUS LEESON is a 53 y.o. right-hand dominant male who presents to Urgent Care today for left index finger pain. Patient has pain and swelling at the left index finger PIP joint. He denies any injury. Symptoms have been present for the last 5 days. Symptoms are moderate and worse with activity. He denies any personal or family history significant for rheumatologic conditions such as rheumatoid arthritis lupus gout or psoriasis or psoriatic arthritis. He does note that he was wheeding in his yard prior to the pain starting. He feels well otherwise with no fevers or chills nausea vomiting or diarrhea.   Past Medical History  Diagnosis Date  . Hypertension   . Radiculopathy, lumbar region   . Arthritis   . Personal history of colonic adenomas 02/23/2013    02/23/2013 6 small polyps removed, 5 recovered - all adenomas repeat colonoscopy 02/2016    . Fracture of rib of left side 04/2013   History  Substance Use Topics  . Smoking status: Never Smoker   . Smokeless tobacco: Not on file  . Alcohol Use: No   ROS as above Medications: No current facility-administered medications for this encounter.   Current Outpatient Prescriptions  Medication Sig Dispense Refill  . ALPRAZolam (XANAX) 0.5 MG tablet TAKE 1 TABLET EVERY DAY  30 tablet  3  . buPROPion (WELLBUTRIN XL) 300 MG 24 hr tablet TAKE 1 TABLET (300 MG TOTAL) BY MOUTH DAILY.  30 tablet  10  . cefUROXime (CEFTIN) 500 MG tablet Take 1 tablet (500 mg total) by mouth 2 (two) times daily with a meal.  14 tablet  0  . citalopram (CELEXA) 20 MG tablet TAKE 1 TABLET BY MOUTH EVERY DAY  30 tablet  3  . KRILL OIL 1000 MG CAPS Take 2 capsules by mouth 2 (two) times daily.        . multivitamin (ONE-A-DAY MEN'S) TABS Take 1 tablet by mouth daily.       Marland Kitchen sulfamethoxazole-trimethoprim (SEPTRA DS) 800-160 MG per tablet Take 2 tablets by mouth 2 (two) times daily.  28 tablet  0  . valACYclovir (VALTREX) 500 MG tablet TAKE 1 TABLET BY MOUTH TWICE A DAY  60  tablet  6  . zolpidem (AMBIEN) 10 MG tablet TAKE 1 TABLET AT BEDTIME  30 tablet  5    Exam:  BP 145/70  Pulse 57  Temp(Src) 99.9 F (37.7 C) (Oral)  Resp 18  SpO2 98% Gen: Well NAD Left index finger: Mild erythema tenderness and swelling at the PIP. No extending erythema. Motion is intact. Capillary refill sensation and pulses are intact. Tenderness is maximal on along the radial aspect of the finger PIP.  No results found for this or any previous visit (from the past 24 hour(s)). Dg Hand Complete Left  10/30/2013   CLINICAL DATA:  Pain and soft tissue swelling in the left index finger, primarily at the PIP joint.  EXAM: LEFT HAND - COMPLETE 3+ VIEW  COMPARISON:  None.  FINDINGS: No acute osseous or joint abnormality. There is soft tissue swelling involving the second digit. No osseous erosions.  IMPRESSION: Soft tissue swelling involving the first digit without acute osseous or joint abnormality.   Electronically Signed   By: Lorin Picket M.D.   On: 10/30/2013 10:01    Assessment and Plan: 53 y.o. male with cellulitis versus synovitis.  I suspect patient may have had a small foreign body such as a plant thorn prick his finger while weeding a few days ago  and he has subsequently developed cellulitis. However synovitis or joint effusion is also a possibility. We'll treat patient with both Bactrim and Ceftin to cover MRSA and strep species.  Return to clinic if not improved. If not better would consider steroids to treat possible rheumatologic synovitis versus effusion.    Discussed warning signs or symptoms. Please see discharge instructions. Patient expresses understanding.   This note was created using Systems analyst. Any transcription errors are unintended.    Gregor Hams, MD 10/30/13 1018

## 2013-10-30 NOTE — ED Notes (Signed)
Pt reports pain to left index finger onset 6 days Swelling, tender; pain increases w/activity Denies inj/trauma Alert; no signs of acute distress.

## 2013-10-31 ENCOUNTER — Ambulatory Visit (INDEPENDENT_AMBULATORY_CARE_PROVIDER_SITE_OTHER): Payer: PRIVATE HEALTH INSURANCE | Admitting: Psychology

## 2013-10-31 DIAGNOSIS — F331 Major depressive disorder, recurrent, moderate: Secondary | ICD-10-CM

## 2013-10-31 DIAGNOSIS — F1010 Alcohol abuse, uncomplicated: Secondary | ICD-10-CM

## 2013-11-06 ENCOUNTER — Telehealth (HOSPITAL_BASED_OUTPATIENT_CLINIC_OR_DEPARTMENT_OTHER): Payer: Self-pay | Admitting: Emergency Medicine

## 2013-11-07 ENCOUNTER — Ambulatory Visit (INDEPENDENT_AMBULATORY_CARE_PROVIDER_SITE_OTHER): Payer: PRIVATE HEALTH INSURANCE | Admitting: Family Medicine

## 2013-11-07 ENCOUNTER — Encounter: Payer: Self-pay | Admitting: Family Medicine

## 2013-11-07 VITALS — BP 122/90 | HR 72 | Temp 98.0°F | Ht 75.0 in | Wt 213.0 lb

## 2013-11-07 DIAGNOSIS — F329 Major depressive disorder, single episode, unspecified: Secondary | ICD-10-CM | POA: Insufficient documentation

## 2013-11-07 DIAGNOSIS — B002 Herpesviral gingivostomatitis and pharyngotonsillitis: Secondary | ICD-10-CM | POA: Insufficient documentation

## 2013-11-07 DIAGNOSIS — L02519 Cutaneous abscess of unspecified hand: Secondary | ICD-10-CM

## 2013-11-07 DIAGNOSIS — L03012 Cellulitis of left finger: Secondary | ICD-10-CM

## 2013-11-07 DIAGNOSIS — F32A Depression, unspecified: Secondary | ICD-10-CM | POA: Insufficient documentation

## 2013-11-07 DIAGNOSIS — L03019 Cellulitis of unspecified finger: Secondary | ICD-10-CM

## 2013-11-07 NOTE — Progress Notes (Signed)
  Garret Reddish, MD Phone: [phone]  Subjective:   Curtis Hendrix is a 53 y.o. year old very pleasant male patient who presents with the following:  Cellulitis follow up/finger pain  Review of records:Seen a week ago at urgent care after injury to 2nd finger left hand with subsequent swelling and redness. Diagnosed with cellulitis and started on bactrim and Ceftin x 1 week. X-ray of finger at that time showed swelling of 1st digit but none of 2nd digit which was affected.   Today, patient states redness has resolved but swelling persisted. He has some pain when he pushes at PIP joint or when he bends his finger. He has full range of motion when pushing past edema. He was told to follow up at urgent care but they are closed this week for renovations.    ROS- no fevers/chills/expanding redness. No nausea/vomiting  Past Medical History-no history rheumatic disease and denies arthritic symptoms Patient Active Problem List   Diagnosis Date Noted  . Depression 11/07/2013    Priority: Medium  . Oral herpes simplex infection 11/07/2013    Priority: Medium  . HYPERLIPIDEMIA 09/09/2007    Priority: Medium  . HYPERTENSION 06/10/2007    Priority: Medium  . Personal history of colonic adenomas 02/23/2013    Priority: Low  . Chronic prostatitis 09/09/2007    Priority: Low  . CHEST PAIN UNSPECIFIED 07/19/2007    Priority: Low   Medications- reviewed and updated Current Outpatient Prescriptions  Medication Sig Dispense Refill  . buPROPion (WELLBUTRIN XL) 300 MG 24 hr tablet TAKE 1 TABLET (300 MG TOTAL) BY MOUTH DAILY.  30 tablet  10  . citalopram (CELEXA) 20 MG tablet TAKE 1 TABLET BY MOUTH EVERY DAY  30 tablet  3  . KRILL OIL 1000 MG CAPS Take 2 capsules by mouth 2 (two) times daily.        . multivitamin (ONE-A-DAY MEN'S) TABS Take 1 tablet by mouth daily.       . valACYclovir (VALTREX) 500 MG tablet TAKE 1 TABLET BY MOUTH TWICE A DAY  60 tablet  6  . zolpidem (AMBIEN) 10 MG tablet  TAKE 1 TABLET AT BEDTIME  30 tablet  5   No current facility-administered medications for this visit.    Objective: BP 122/90  Pulse 72  Temp(Src) 98 F (36.7 C)  Ht 6\' 3"  (1.905 m)  Wt 213 lb (96.616 kg)  BMI 26.62 kg/m2 Gen: NAD, resting comfortably MSK: mild swelling at PIP left second finger, no erythema, full range of motion, some pain with compression on all sides of PIP joint. No obvious foreign body.   Assessment/Plan:  Cellulitis follow up Infectious component has resolved regarding cellulitis but still has residual swelling. Doubt joint infection. No arthritis on x-ray from last week. Advised icing and/or ibuprofen. Likely to resolve within 2 weeks. Red flags for return given.   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 On xanax prn ordered but he does not take, took medicine off of list. Will also have patient back to see if we could titrate off of medication (perhaps start with celexa).

## 2013-11-07 NOTE — Patient Instructions (Signed)
Cellulitis-seems to have resolved. I think you have some residual swelling causing your pain and discomfort. You can try aleve or ibuprofen or ice finger about 5 minutes 3x a day. This should get better within about 2 weeks. If symptoms worsen or you have return of redness, please come see [place].   May tell front desk to add as 3rd 30 minute visit on a half day at your convenience (september or October fine)

## 2013-11-16 ENCOUNTER — Ambulatory Visit (INDEPENDENT_AMBULATORY_CARE_PROVIDER_SITE_OTHER): Payer: PRIVATE HEALTH INSURANCE | Admitting: Psychology

## 2013-11-16 DIAGNOSIS — F1010 Alcohol abuse, uncomplicated: Secondary | ICD-10-CM

## 2013-11-16 DIAGNOSIS — F331 Major depressive disorder, recurrent, moderate: Secondary | ICD-10-CM

## 2013-12-02 ENCOUNTER — Encounter (HOSPITAL_COMMUNITY): Payer: Self-pay | Admitting: Emergency Medicine

## 2013-12-02 ENCOUNTER — Emergency Department (HOSPITAL_COMMUNITY)
Admission: EM | Admit: 2013-12-02 | Discharge: 2013-12-02 | Disposition: A | Discharge status: Home / Self Care | Payer: PRIVATE HEALTH INSURANCE | Attending: Emergency Medicine | Admitting: Emergency Medicine

## 2013-12-02 DIAGNOSIS — Z8739 Personal history of other diseases of the musculoskeletal system and connective tissue: Secondary | ICD-10-CM | POA: Diagnosis not present

## 2013-12-02 DIAGNOSIS — S51809A Unspecified open wound of unspecified forearm, initial encounter: Secondary | ICD-10-CM | POA: Diagnosis present

## 2013-12-02 DIAGNOSIS — Z79899 Other long term (current) drug therapy: Secondary | ICD-10-CM | POA: Diagnosis not present

## 2013-12-02 DIAGNOSIS — Z8781 Personal history of (healed) traumatic fracture: Secondary | ICD-10-CM | POA: Insufficient documentation

## 2013-12-02 DIAGNOSIS — Z860100 Personal history of colon polyps, unspecified: Secondary | ICD-10-CM | POA: Insufficient documentation

## 2013-12-02 DIAGNOSIS — I10 Essential (primary) hypertension: Secondary | ICD-10-CM | POA: Insufficient documentation

## 2013-12-02 DIAGNOSIS — S51831A Puncture wound without foreign body of right forearm, initial encounter: Secondary | ICD-10-CM

## 2013-12-02 NOTE — ED Provider Notes (Signed)
CSN: [id number]     Arrival date & time 12/02/13  1955 History   First MD Initiated Contact with Patient 12/02/13 2037     Chief Complaint  Patient presents with  . Puncture Wound     (Consider location/radiation/quality/duration/timing/severity/associated sxs/prior Treatment) HPI Pt is a 53yo male presenting to ED with concern for snake bite to right forearm about 1 hour PTA. Pt states he was working in his yard around rocks and brush, when he reached into the brush, he felt a sharp sudden pain to his right forearm and noticed bleeding and 2 puncture marks about 1cm apart.  Reports pain during incident but states it has resolved. Bleeding controlled PTA.  Denies pain, swelling, redness, or bruising to site at this time. Denies n/v/d, chills or SOB. Reports tetanus was last given in 2013 prior to trip to [place].  Denies any other injuries or significant PMH.   Past Medical History  Diagnosis Date  . Hypertension   . Radiculopathy, lumbar region   . Arthritis   . Personal history of colonic adenomas 02/23/2013    02/23/2013 6 small polyps removed, 5 recovered - all adenomas repeat colonoscopy 02/2016    . Fracture of rib of left side 04/2013   Past Surgical History  Procedure Laterality Date  . L4-5    . Lumbar lamninectomy    . Tonsillectomy    . Wisdom tooth extraction     Family History  Problem Relation Age of Onset  . Kidney cancer    . Colon cancer Neg Hx    History  Substance Use Topics  . Smoking status: Never Smoker   . Smokeless tobacco: Not on file  . Alcohol Use: No    Review of Systems  Respiratory: Negative for cough and shortness of breath.   Cardiovascular: Negative for chest pain and palpitations.  Gastrointestinal: Negative for nausea, vomiting and diarrhea.  Musculoskeletal: Positive for arthralgias, gait problem, myalgias and neck pain. Negative for back pain and joint swelling.  Skin: Negative for color change and wound.  Neurological: Negative for  dizziness, weakness, light-headedness, numbness and headaches.  All other systems reviewed and are negative.     Allergies  Review of patient's allergies indicates no known allergies.  Home Medications   Prior to Admission medications   Medication Sig Start Date End Date Taking? Authorizing Provider  buPROPion (WELLBUTRIN XL) 300 MG 24 hr tablet Take 300 mg by mouth at bedtime.    Yes Historical Provider, MD  citalopram (CELEXA) 20 MG tablet Take 20 mg by mouth daily.   Yes Historical Provider, MD  KRILL OIL 1000 MG CAPS Take 2,000 mg by mouth 2 (two) times daily.    Yes Historical Provider, MD  multivitamin (ONE-A-DAY MEN'S) TABS Take 1 tablet by mouth daily.    Yes Historical Provider, MD  valACYclovir (VALTREX) 500 MG tablet Take 500 mg by mouth 2 (two) times daily.   Yes Historical Provider, MD   BP 146/96  Pulse 64  Temp(Src) 98.1 F (36.7 C) (Oral)  Resp 18  SpO2 100% Physical Exam  Nursing note and vitals reviewed. Constitutional: He is oriented to person, place, and time. He appears well-developed and well-nourished.  Pt lying comfortably in exam bed, NAD.   HENT:  Head: Normocephalic and atraumatic.  Eyes: EOM are normal.  Neck: Normal range of motion.  Cardiovascular: Normal rate, regular rhythm and normal heart sounds.   Pulses:      Radial pulses are 2+ on the right  side.  Pulmonary/Chest: Effort normal and breath sounds normal. No respiratory distress. He has no wheezes.  Musculoskeletal: Normal range of motion.  Neurological: He is alert and oriented to person, place, and time.  Skin: Skin is warm and dry.  Right mid-forearm, dorsal aspect- 2 puncture wounds 1cm apart. Scant dried blood, no active bleeding. No surrounding erythema, ecchymosis or edema. No induration or fluctuance.   Psychiatric: He has a normal mood and affect. His behavior is normal.    ED Course  Procedures   The wound is cleansed, debrided of foreign material as much as possible, and  dressed. The patient is alerted to watch for any signs of infection (redness, pus, pain, increased swelling or fever) and call if such occurs. Home wound care instructions are provided. Tetanus vaccination status reviewed: UTD   Labs Review Labs Reviewed - No data to display  Imaging Review No results found.   EKG Interpretation None      MDM   Final diagnoses:  Puncture wound of right forearm, initial encounter    Pt presenting to ED with 2 puncture wounds to right forearm, no active bleeding. Vitals: WNL.  Pt is concerned for possible snake bite, however, did not see a snake, was working in brush at time of incident.  Right arm is neurovascularly in tact. No evidence of underlying infection.  Wound care instructions provided. Return precautions provided. Pt verbalized understanding and agreement with tx plan.     Noland Fordyce, PA-C 12/02/13 2143

## 2013-12-02 NOTE — ED Notes (Signed)
Pt presents 2 puncture wounds 1 cm apart to right forearm- pt was working in the yard and reached into a bush and felt a prick- later pt noticed bleeding to sleeve of shirt.  Pt unsure if a snake bit him or not.  Bleeding controlled at present.  Pt denies pain at this time.  Last tetanus shot was in 2013.

## 2013-12-03 NOTE — ED Provider Notes (Signed)
Medical screening examination/treatment/procedure(s) were performed by non-physician practitioner and as supervising physician I was immediately available for consultation/collaboration.   EKG Interpretation None        Houston Siren III, MD 12/03/13 [phone]

## 2013-12-10 ENCOUNTER — Other Ambulatory Visit: Payer: Self-pay | Admitting: Internal Medicine

## 2013-12-10 ENCOUNTER — Other Ambulatory Visit: Payer: Self-pay | Admitting: Family Medicine

## 2013-12-11 NOTE — Telephone Encounter (Signed)
medicatio refilled

## 2013-12-11 NOTE — Telephone Encounter (Signed)
Is this ok to refill?  

## 2013-12-18 ENCOUNTER — Encounter: Payer: Self-pay | Admitting: Family Medicine

## 2013-12-18 DIAGNOSIS — G4700 Insomnia, unspecified: Secondary | ICD-10-CM | POA: Insufficient documentation

## 2013-12-21 ENCOUNTER — Encounter: Payer: PRIVATE HEALTH INSURANCE | Admitting: Family Medicine

## 2014-01-02 ENCOUNTER — Other Ambulatory Visit (INDEPENDENT_AMBULATORY_CARE_PROVIDER_SITE_OTHER): Payer: PRIVATE HEALTH INSURANCE

## 2014-01-02 DIAGNOSIS — Z0000 Encounter for general adult medical examination without abnormal findings: Secondary | ICD-10-CM

## 2014-01-02 DIAGNOSIS — E785 Hyperlipidemia, unspecified: Secondary | ICD-10-CM

## 2014-01-02 LAB — LIPID PANEL
CHOLESTEROL: 205 mg/dL — AB (ref 0–200)
HDL: 57.9 mg/dL (ref >39.00)
NonHDL: 147.1
Total CHOL/HDL Ratio: 4
Triglycerides: 221 mg/dL — ABNORMAL HIGH (ref 0.0–149.0)
VLDL: 44.2 mg/dL — AB (ref 0.0–40.0)

## 2014-01-02 LAB — POCT URINALYSIS DIPSTICK
Bilirubin, UA: NEGATIVE
Blood, UA: NEGATIVE
Glucose, UA: NEGATIVE
KETONES UA: NEGATIVE
Leukocytes, UA: NEGATIVE
Nitrite, UA: NEGATIVE
PH UA: 7.5
Protein, UA: NEGATIVE
Spec Grav, UA: 1.015
Urobilinogen, UA: 0.2

## 2014-01-02 LAB — CBC WITH DIFFERENTIAL/PLATELET
BASOS PCT: 0.6 % (ref 0.0–3.0)
Basophils Absolute: 0 10*3/uL (ref 0.0–0.1)
EOS ABS: 0.1 10*3/uL (ref 0.0–0.7)
EOS PCT: 1.4 % (ref 0.0–5.0)
HCT: 44.2 % (ref 39.0–52.0)
Hemoglobin: 15 g/dL (ref 13.0–17.0)
LYMPHS PCT: 26.2 % (ref 12.0–46.0)
Lymphs Abs: 1.3 10*3/uL (ref 0.7–4.0)
MCHC: 33.9 g/dL (ref 30.0–36.0)
MCV: 95.9 fl (ref 78.0–100.0)
Monocytes Absolute: 0.5 10*3/uL (ref 0.1–1.0)
Monocytes Relative: 9.9 % (ref 3.0–12.0)
NEUTROS PCT: 61.9 % (ref 43.0–77.0)
Neutro Abs: 3 10*3/uL (ref 1.4–7.7)
PLATELETS: 213 10*3/uL (ref 150.0–400.0)
RBC: 4.61 Mil/uL (ref 4.22–5.81)
RDW: 13.3 % (ref 11.5–15.5)
WBC: 4.9 10*3/uL (ref 4.0–10.5)

## 2014-01-02 LAB — HEPATIC FUNCTION PANEL
ALK PHOS: 59 U/L (ref 39–117)
ALT: 27 U/L (ref 0–53)
AST: 28 U/L (ref 0–37)
Albumin: 3.6 g/dL (ref 3.5–5.2)
BILIRUBIN DIRECT: 0.2 mg/dL (ref 0.0–0.3)
BILIRUBIN TOTAL: 1.2 mg/dL (ref 0.2–1.2)
TOTAL PROTEIN: 7.3 g/dL (ref 6.0–8.3)

## 2014-01-02 LAB — BASIC METABOLIC PANEL
BUN: 9 mg/dL (ref 6–23)
CALCIUM: 9.4 mg/dL (ref 8.4–10.5)
CO2: 24 mEq/L (ref 19–32)
CREATININE: 0.9 mg/dL (ref 0.4–1.5)
Chloride: 103 mEq/L (ref 96–112)
GFR: 99.36 mL/min (ref >60.00)
Glucose, Bld: 105 mg/dL — ABNORMAL HIGH (ref 70–99)
Potassium: 4.8 mEq/L (ref 3.5–5.1)
Sodium: 140 mEq/L (ref 135–145)

## 2014-01-02 LAB — TSH: TSH: 1.1 u[IU]/mL (ref 0.35–4.50)

## 2014-01-02 LAB — LDL CHOLESTEROL, DIRECT: LDL DIRECT: 115.6 mg/dL

## 2014-01-02 LAB — PSA: PSA: 0.61 ng/mL (ref 0.10–4.00)

## 2014-01-08 ENCOUNTER — Telehealth: Payer: Self-pay

## 2014-01-08 NOTE — Telephone Encounter (Signed)
Spoke with pt and pt is aware of lab results.  Pt was suppose to come in for a physical on 10.27.2015 but cannot make it.  Pt would like to know if he can be seen sooner. Pt has a physical appt for 4.26.15.  Pls advise.

## 2014-01-08 NOTE — Telephone Encounter (Signed)
Can you try to work him in for a physical within the next 2 months? He can be up to the third 30 minute visit for that half day. If you cant find one of these he can be the fourth

## 2014-01-09 ENCOUNTER — Telehealth: Payer: Self-pay | Admitting: Family Medicine

## 2014-01-09 ENCOUNTER — Encounter: Payer: PRIVATE HEALTH INSURANCE | Admitting: Family Medicine

## 2014-01-09 MED ORDER — BUPROPION HCL ER (XL) 300 MG PO TB24: 300.0000 mg | ORAL_TABLET | Freq: Every day | ORAL | Status: DC

## 2014-01-09 NOTE — Telephone Encounter (Signed)
Medication refilled

## 2014-01-09 NOTE — Telephone Encounter (Signed)
[id number] - [place], [place] [address] is requesting re-fill on buPROPion (WELLBUTRIN XL) 300 MG 24 hr tablet

## 2014-01-09 NOTE — Telephone Encounter (Signed)
Left message on voicemail asking pt to call back, we can work pt in on 02/15/14 at 9am.

## 2014-01-10 NOTE — Telephone Encounter (Signed)
Pt will accept the 9 am appt on 12/3. Pls continue to have on wait list. Thanks. Pt has been scheduled

## 2014-02-15 ENCOUNTER — Ambulatory Visit: Payer: PRIVATE HEALTH INSURANCE | Admitting: Family Medicine

## 2014-02-15 ENCOUNTER — Encounter: Payer: Self-pay | Admitting: Family Medicine

## 2014-02-15 ENCOUNTER — Ambulatory Visit (INDEPENDENT_AMBULATORY_CARE_PROVIDER_SITE_OTHER): Payer: PRIVATE HEALTH INSURANCE

## 2014-02-15 ENCOUNTER — Ambulatory Visit (INDEPENDENT_AMBULATORY_CARE_PROVIDER_SITE_OTHER): Payer: PRIVATE HEALTH INSURANCE | Admitting: Family Medicine

## 2014-02-15 VITALS — BP 130/82 | Temp 98.2°F | Ht 75.0 in | Wt 218.0 lb

## 2014-02-15 DIAGNOSIS — R0683 Snoring: Secondary | ICD-10-CM

## 2014-02-15 DIAGNOSIS — Z0000 Encounter for general adult medical examination without abnormal findings: Secondary | ICD-10-CM

## 2014-02-15 DIAGNOSIS — F32A Depression, unspecified: Secondary | ICD-10-CM

## 2014-02-15 DIAGNOSIS — Z23 Encounter for immunization: Secondary | ICD-10-CM

## 2014-02-15 DIAGNOSIS — IMO0001 Reserved for inherently not codable concepts without codable children: Secondary | ICD-10-CM

## 2014-02-15 DIAGNOSIS — F329 Major depressive disorder, single episode, unspecified: Secondary | ICD-10-CM

## 2014-02-15 DIAGNOSIS — G4700 Insomnia, unspecified: Secondary | ICD-10-CM

## 2014-02-15 DIAGNOSIS — E785 Hyperlipidemia, unspecified: Secondary | ICD-10-CM

## 2014-02-15 DIAGNOSIS — G4733 Obstructive sleep apnea (adult) (pediatric): Secondary | ICD-10-CM | POA: Insufficient documentation

## 2014-02-15 MED ORDER — BUPROPION HCL ER (XL) 300 MG PO TB24: 300.0000 mg | ORAL_TABLET | Freq: Every day | ORAL | Status: DC

## 2014-02-15 MED ORDER — CITALOPRAM HYDROBROMIDE 10 MG PO TABS: 10.0000 mg | ORAL_TABLET | Freq: Every day | ORAL | Status: DC

## 2014-02-15 MED ORDER — VALACYCLOVIR HCL 500 MG PO TABS: 500.0000 mg | ORAL_TABLET | Freq: Two times a day (BID) | ORAL | Status: DC

## 2014-02-15 NOTE — Assessment & Plan Note (Signed)
Send For sleep study as history concerning for sleep apnea

## 2014-02-15 NOTE — Patient Instructions (Addendum)
They will call you about your sleep study.   Let's cut the celexa down to 10mg .   In 4-6 months fill out the phq9 sheet and send me your total score specifically if any thoughts of hurting yourself I would need to know. If your score is 5 or less we will consider stopping celexa completely  If you don't mind when you go to [facility], would be great to at least have a copy of your stress test through Dr. Morton Stall.   Flu shot.

## 2014-02-15 NOTE — Assessment & Plan Note (Signed)
Triglycerides have trended up as exercise is trending down. 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 They will also check fasting cbg which was mildly elevated with decreased exercise.

## 2014-02-15 NOTE — Assessment & Plan Note (Signed)
Well-controlled with PHQ9 2 of 0. Titrate Celexa down to 10 mg and follow-up in 3-6 months with PHQ9 patient will send the results of. We will consider coming off celexa completely and remaining on Wellbutrin for at least a year. And consider titration down on 150 mg of wellbutrin after at least 6 months of stability.

## 2014-02-15 NOTE — Progress Notes (Signed)
Curtis Reddish, MD Phone: [phone]  Subjective:  Patient presents today to establish care with me as their new primary care provider and for annual physical. Patient was formerly a patient of Dr. Arnoldo Morale. Chief complaint-noted.   Patient states overall he has been doing well. He does state that About 90 days ago exercise started suffering. Had been 6 am everyday exercising. Doing 3 days a week currently. This decrease could possibly explain the increase in glucose and triglycerides. He has follow-up executive physical at [facility] in January and he will send me the results. He also has concerns about the following  Snoring Markedly worse over recent years but has snored for several years. Daytime sleepiness. Takes a nap after work sometimes.   Depression PHQ9 score of 2 and this may be related to sleep apnea-both sleep/energy issues. He has been stable on Wellbutrin 3 mg and Celexa 20 mg for several years.  ROS-full ROS completed and negative except as above complaints of snoring and related.   The following were reviewed and entered/updated in epic: Past Medical History  Diagnosis Date  . Hypertension   . Radiculopathy, lumbar region   . Arthritis   . Personal history of colonic adenomas 02/23/2013    02/23/2013 6 small polyps removed, 5 recovered - all adenomas repeat colonoscopy 02/2016    . Fracture of rib of left side 04/2013  . Hearing difficulty     left ear-since college. high frequency loss.    Patient Active Problem List   Diagnosis Date Noted  . Depression 11/07/2013    Priority: Medium  . Oral herpes simplex infection 11/07/2013    Priority: Medium  . Hyperlipidemia 09/09/2007    Priority: Medium  . HYPERTENSION 06/10/2007    Priority: Medium  . Insomnia 12/18/2013    Priority: Low  . Personal history of colonic adenomas 02/23/2013    Priority: Low  . Chronic prostatitis 09/09/2007    Priority: Low  . CHEST PAIN UNSPECIFIED 07/19/2007    Priority: Low  .  Snoring 02/15/2014   Past Surgical History  Procedure Laterality Date  . Lumbar lamninectomy      l4-l5 for drop foot  . Tonsillectomy    . Wisdom tooth extraction    . Tympanostomy      tubes as child    Family History  Problem Relation Age of Onset  . Kidney cancer Brother   . Colon cancer Neg Hx   . Transient ischemic attack Mother     age 44    Medications- reviewed and updated Current Outpatient Prescriptions  Medication Sig Dispense Refill  . buPROPion (WELLBUTRIN XL) 300 MG 24 hr tablet Take 1 tablet (300 mg total) by mouth at bedtime. 30 tablet 1  . citalopram (CELEXA) 20 MG tablet TAKE 1 TABLET BY MOUTH EVERY DAY 30 tablet 3  . KRILL OIL 1000 MG CAPS Take 2,000 mg by mouth 2 (two) times daily.     . multivitamin (ONE-A-DAY MEN'S) TABS Take 1 tablet by mouth daily.     . valACYclovir (VALTREX) 500 MG tablet Take 500 mg by mouth 2 (two) times daily.     Allergies-reviewed and updated No Known Allergies  History   Social History  . Marital Status: Married    Spouse Name: N/A    Number of Children: N/A  . Years of Education: N/A   Occupational History  . [employer]    Social History Main Topics  . Smoking status: Never Smoker   . Smokeless tobacco: None  .  Alcohol Use: 0.0 oz/week    0 Not specified per week     Comment: intermittent once a month  . Drug Use: No  . Sexual Activity: Yes   Other Topics Concern  . None   Social History Narrative   Married. 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 Oropharynx normal. Good dentition.  Neck: no  thyromegaly CV: RRR no murmurs rubs or gallops Lungs: CTAB no crackles, wheeze, rhonchi Abdomen: soft/nontender/nondistended/normal bowel sounds. No rebound or guarding.  Ext: no edema, 2+ PT and radial pulses Skin: warm, dry, no rash Neuro: grossly normal, moves all extremities, PERRLA   Assessment/Plan:  53 y.o. male presenting for annual physical.  Health Maintenance counseling: 1. Anticipatory guidance: Patient counseled regarding regular dental exams, wearing seatbelts and sunscreen.  2. Risk factor reduction:  Advised patient of need for regular exercise and diet rich and fruits and vegetables to reduce risk of heart attack and stroke.  3. Immunizations/screenings/ancillary studies- flu shot today, otherwise uptodate.   Snoring Send For sleep study as history concerning for sleep apnea  Depression Well-controlled with PHQ9 2 of 0. Titrate Celexa down to 10 mg and follow-up in 3-6 months with PHQ9 patient will send the results of. We will consider coming off celexa completely and remaining on Wellbutrin for at least a year. And consider titration down on 150 mg of wellbutrin after at least 6 months of stability.   Hyperlipidemia Triglycerides have trended up as exercise is trending down. 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 They will also check fasting cbg which was mildly elevated with decreased exercise.   1 year follow up.   Orders Placed This Encounter  Procedures  . Split night study    Standing Status: Future     Number of Occurrences:      Standing Expiration Date: 02/15/2015    Order Specific Question:  Where should this test be performed:    Answer:  [facility] ordered this encounter  Medications  . citalopram (CELEXA) 10 MG tablet    Sig: Take 1 tablet (10 mg total) by mouth daily.    Dispense:  90 tablet    Refill:  2  . buPROPion (WELLBUTRIN XL) 300 MG 24 hr tablet     Sig: Take 1 tablet (300 mg total) by mouth at bedtime.    Dispense:  90 tablet    Refill:  3  . valACYclovir (VALTREX) 500 MG tablet    Sig: Take 1 tablet (500 mg total) by mouth 2 (two) times daily.    Dispense:  180 tablet    Refill:  3

## 2014-03-05 ENCOUNTER — Ambulatory Visit: Payer: PRIVATE HEALTH INSURANCE | Admitting: Psychology

## 2014-03-21 ENCOUNTER — Ambulatory Visit: Payer: PRIVATE HEALTH INSURANCE | Admitting: Psychology

## 2014-03-30 LAB — LIPID PANEL
Cholesterol: 170 mg/dL (ref 0–200)
HDL: 51 mg/dL (ref 35–70)
LDL Cholesterol: 102 mg/dL
Triglycerides: 84 mg/dL (ref 40–160)

## 2014-03-30 LAB — PSA: PSA: 0.48

## 2014-03-30 LAB — HM DEXA SCAN: HM DEXA SCAN: NORMAL

## 2014-04-17 ENCOUNTER — Ambulatory Visit: Payer: PRIVATE HEALTH INSURANCE | Admitting: Psychology

## 2014-05-04 ENCOUNTER — Ambulatory Visit (HOSPITAL_BASED_OUTPATIENT_CLINIC_OR_DEPARTMENT_OTHER): Payer: No Typology Code available for payment source | Attending: Family Medicine | Admitting: Radiology

## 2014-05-04 VITALS — Ht 74.0 in | Wt 210.0 lb

## 2014-05-04 DIAGNOSIS — R0683 Snoring: Secondary | ICD-10-CM | POA: Insufficient documentation

## 2014-05-10 ENCOUNTER — Ambulatory Visit: Payer: PRIVATE HEALTH INSURANCE | Admitting: Psychology

## 2014-05-16 DIAGNOSIS — G4733 Obstructive sleep apnea (adult) (pediatric): Secondary | ICD-10-CM

## 2014-05-16 NOTE — Sleep Study (Signed)
   NAME: Curtis Hendrix DATE OF BIRTH:  07/30/1960 MEDICAL RECORD NUMBER [id number]  LOCATION: [facility]  PHYSICIAN: Kathee Delton  DATE OF STUDY: 05/04/2014  SLEEP STUDY TYPE: Nocturnal Polysomnogram               REFERRING PHYSICIAN: Marin Olp, MD  INDICATION FOR STUDY: Hypersomnia with sleep apnea  EPWORTH SLEEPINESS SCORE:  3 HEIGHT: 6\' 2"  (188 cm)  WEIGHT: 210 lb (95.255 kg)    Body mass index is 26.95 kg/(m^2).  NECK SIZE: 17 in.  MEDICATIONS: Reviewed in the sleep record  SLEEP ARCHITECTURE: The patient was found to have a total sleep time of 310 minutes, with no slow-wave sleep and only 78 minutes of REM. Sleep onset latency was prolonged at 49 minutes, and REM latency was normal at 62 minutes. Sleep efficiency was moderately reduced at 80%.  RESPIRATORY DATA: The patient was found to have 20 apneas and 123 obstructive hypopneas, giving him an AHI of 28 events per hour. The events occurred primarily in the supine position, and there was loud snoring noted throughout. The patient did not meet split-night protocol secondary to the majority of his events occurring well after 2 AM.  OXYGEN DATA: There was transient oxygen desaturation as low as 85% with the patient's obstructive events  CARDIAC DATA: Occasional PAC and PVC noted  MOVEMENT/PARASOMNIA: The patient was noted to have 200 periodic limb movements, with very little sleep disruption. There were no abnormal behaviors seen.  IMPRESSION/ RECOMMENDATION:    1)  moderate obstructive sleep apnea/hypopnea syndrome, with an AHI of 28 events per hour and oxygen desaturation as low as 85%. Treatment for this degree of sleep apnea can include a trial of weight loss alone if applicable, upper airway surgery, dental appliance, or C Pap. Clinical correlation is suggested.  2) occasional PAC and PVC noted, but no clinically significant arrhythmias were seen  3) large numbers of periodic limb movements  with very little sleep disruption. This is most likely related to the patient's sleep disordered breathing. If the patient continues to have significant sleep issues despite adequate treatment of his sleep apnea, consideration could be given to a primary movement disorder of sleep.     [place], American Board of Sleep Medicine  ELECTRONICALLY SIGNED ON:  05/16/2014, 6:01 PM [place] PH: [phone]   FX: [phone] [place]

## 2014-05-18 ENCOUNTER — Telehealth: Payer: Self-pay | Admitting: Family Medicine

## 2014-05-18 NOTE — Telephone Encounter (Signed)
-----   Message from Angela Adam, [place] sent at 05/18/2014  3:12 PM EST ----- Called pt yesterday and today and left voice messages. Will send a letter Monday if   I havent reached him by end of day today. ----- Message -----    From: Marin Olp, MD    Sent: 05/16/2014   6:14 PM      To: [place]  Please call patient and inform him his sleep study did show moderate sleep apnea. They stated we could consider weight loss which I do not think is really an option for him as weight very reasonable. Upper airway surgery which I also think would be low yield. Or CPAP, I think we should consider CPAP.  If he is interested in this, can you call the sleep clinic and see how we would get him set up with CPAP and titration?

## 2014-05-18 NOTE — Telephone Encounter (Signed)
thanks

## 2014-05-30 ENCOUNTER — Telehealth: Payer: Self-pay | Admitting: Family Medicine

## 2014-05-30 NOTE — Telephone Encounter (Signed)
Pt returned your called and would like a call back

## 2014-05-31 NOTE — Telephone Encounter (Signed)
Called pt back and left vm to return call

## 2014-06-20 ENCOUNTER — Encounter: Payer: PRIVATE HEALTH INSURANCE | Admitting: Family Medicine

## 2014-06-21 ENCOUNTER — Telehealth: Payer: Self-pay | Admitting: Family Medicine

## 2014-06-21 ENCOUNTER — Ambulatory Visit (INDEPENDENT_AMBULATORY_CARE_PROVIDER_SITE_OTHER): Payer: Self-pay | Admitting: Psychology

## 2014-06-21 DIAGNOSIS — R0683 Snoring: Secondary | ICD-10-CM

## 2014-06-21 DIAGNOSIS — F332 Major depressive disorder, recurrent severe without psychotic features: Secondary | ICD-10-CM

## 2014-06-21 DIAGNOSIS — F1010 Alcohol abuse, uncomplicated: Secondary | ICD-10-CM

## 2014-06-21 NOTE — Telephone Encounter (Signed)
Dr. Yong Channel is reviewing this.

## 2014-06-21 NOTE — Telephone Encounter (Signed)
Keba let's enter a referral to sleep medicine with Dr. Gwenette Greet and cancel his appointment with me. There was not enough information in sleep study for me to be able to determine appropriate settings for CPAP and Dr. Gwenette Greet can help [place] with that. Please apologize to patient for me, I thought I would be able to order the equipment for him off the study.  I called him 4/7 and left voicemail for him to call and get above message

## 2014-06-21 NOTE — Telephone Encounter (Signed)
Pt states he received call to schedule fup for his sleep study.  Pt has been scheduled for Monday.  Does pt need 15 or 30 min for this fu?

## 2014-06-22 NOTE — Telephone Encounter (Signed)
appt has been cancelled.

## 2014-06-22 NOTE — Telephone Encounter (Signed)
Thank you :)

## 2014-06-22 NOTE — Telephone Encounter (Signed)
Referral entered. Mrs. Curtis Hendrix can you please cancel this pt appointment with [place] please?

## 2014-06-25 ENCOUNTER — Ambulatory Visit: Payer: PRIVATE HEALTH INSURANCE | Admitting: Family Medicine

## 2014-06-28 ENCOUNTER — Encounter: Payer: Self-pay | Admitting: Family Medicine

## 2014-07-09 ENCOUNTER — Encounter: Payer: Self-pay | Admitting: Family Medicine

## 2014-08-09 ENCOUNTER — Institutional Professional Consult (permissible substitution): Payer: Self-pay | Admitting: Pulmonary Disease

## 2014-08-16 ENCOUNTER — Encounter: Payer: Self-pay | Admitting: Pulmonary Disease

## 2014-09-04 ENCOUNTER — Ambulatory Visit (INDEPENDENT_AMBULATORY_CARE_PROVIDER_SITE_OTHER): Payer: Self-pay | Admitting: Psychology

## 2014-09-04 DIAGNOSIS — F332 Major depressive disorder, recurrent severe without psychotic features: Secondary | ICD-10-CM

## 2014-09-04 DIAGNOSIS — F1010 Alcohol abuse, uncomplicated: Secondary | ICD-10-CM

## 2014-10-09 ENCOUNTER — Institutional Professional Consult (permissible substitution): Payer: Self-pay | Admitting: Pulmonary Disease

## 2014-10-10 ENCOUNTER — Other Ambulatory Visit: Payer: Self-pay | Admitting: Family Medicine

## 2014-12-04 ENCOUNTER — Ambulatory Visit (INDEPENDENT_AMBULATORY_CARE_PROVIDER_SITE_OTHER): Payer: No Typology Code available for payment source | Admitting: Pulmonary Disease

## 2014-12-04 ENCOUNTER — Encounter: Payer: Self-pay | Admitting: Pulmonary Disease

## 2014-12-04 VITALS — BP 148/88 | HR 73 | Ht 75.0 in | Wt 221.0 lb

## 2014-12-04 DIAGNOSIS — Z23 Encounter for immunization: Secondary | ICD-10-CM | POA: Diagnosis not present

## 2014-12-04 DIAGNOSIS — G4733 Obstructive sleep apnea (adult) (pediatric): Secondary | ICD-10-CM

## 2014-12-04 NOTE — Patient Instructions (Signed)
You have moderate obstructive sleep apnea - 28/ hour We will set you up with an autoCPAP machine  Call us if any adjustment issues

## 2014-12-04 NOTE — Progress Notes (Signed)
Subjective:    Patient ID: Curtis Hendrix, male    DOB: [birth year], 54 y.o.   MRN: [id number]  HPI  53 year old , presents for evaluation of OSA. His wife has reported loud snoring, he occasionally  wakes himself up for choking episodes during sleep. He travels frequently, but tries to maintain a good sleep regimen. He reports excessive daytime fatigue, denies frank somnolence. Epworth sleepiness score is 8 Bedtime is around 10 PM, sleep latency is about 20 minutes, he sleeps generally on his side with 2 pillows, snoring is worse on his back, denies nocturia or frequent nocturnal arousals and is out of bed by 6 AM feeling refreshed with occasional dryness of mouth but denies headache. There is no history suggestive of cataplexy, sleep paralysis or parasomnias  Overnight polysomnogram 04/2014-weight 210 pounds-showed AHI 28/hour, predominant hypopneas, lowest desaturation to 85%, severe PLM sore noted some of which were associated with arousals   Past Medical History  Diagnosis Date  . Hypertension   . Radiculopathy, lumbar region   . Arthritis   . Personal history of colonic adenomas 02/23/2013    02/23/2013 6 small polyps removed, 5 recovered - all adenomas repeat colonoscopy 02/2016    . Fracture of rib of left side 04/2013  . Hearing difficulty     left ear-since college. high frequency loss.    Past Surgical History  Procedure Laterality Date  . Lumbar lamninectomy      l4-l5 for drop foot  . Tonsillectomy    . Wisdom tooth extraction    . Tympanostomy      tubes as child    No Known Allergies  Social History   Social History  . Marital Status: Married    Spouse Name: N/A  . Number of Children: N/A  . Years of Education: N/A   Occupational History  . [employer]    Social History Main Topics  . Smoking status: Never Smoker   . Smokeless tobacco: Not on file  . Alcohol Use: 0.0 oz/week    0 Standard drinks or equivalent per week     Comment: intermittent once a  month  . Drug Use: No  . Sexual Activity: Yes   Other Topics Concern  . Not on file   Social History Narrative   Married. 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 Duce does not currently have medications on file.   Review of Systems  Constitutional: Negative for fever, chills, activity change, appetite change and unexpected weight change.  HENT: Negative for congestion, dental problem, postnasal drip, rhinorrhea, sneezing, sore throat, trouble swallowing and voice change.   Eyes: Negative for visual disturbance.  Respiratory: Negative for cough, choking and shortness of breath.   Cardiovascular: Negative for chest pain and leg swelling.  Gastrointestinal: Negative for nausea, vomiting and abdominal pain.  Genitourinary: Negative for difficulty urinating.  Musculoskeletal: Negative for arthralgias.  Skin: Negative for rash.  Psychiatric/Behavioral: Negative for behavioral problems and confusion.       Objective:   Physical Exam  Gen. Pleasant, well-nourished, in no distress, normal affect ENT - no lesions, no post nasal drip Neck: No JVD, no thyromegaly, no carotid bruits Lungs: no use of accessory muscles, no dullness to percussion,  clear without rales or rhonchi  Cardiovascular: Rhythm regular, heart sounds  normal, no murmurs or gallops, no peripheral edema Abdomen: soft and non-tender, no hepatosplenomegaly, BS normal. Musculoskeletal: No deformities, no cyanosis or clubbing Neuro:  alert, non focal       Assessment & Plan:

## 2014-12-04 NOTE — Assessment & Plan Note (Signed)
The pathophysiology of obstructive sleep apnea , it's cardiovascular consequences & modes of treatment including CPAP were discused with the patient in detail & they evidenced understanding. He does seem to have moderate OSA. Since he is symptomatic and does not have comorbidities, we will treat him with auto CPAP-will start him on settings of 5-15 cm with nasal pillows and humidity and check download in 4 weeks,  I also discussed the related benefits of using an oral appliance, we will use this only if he fails CPAP therapy

## 2014-12-24 ENCOUNTER — Other Ambulatory Visit: Payer: Self-pay | Admitting: Neurosurgery

## 2014-12-24 DIAGNOSIS — R251 Tremor, unspecified: Secondary | ICD-10-CM

## 2015-01-03 ENCOUNTER — Ambulatory Visit: Payer: Self-pay | Admitting: Pulmonary Disease

## 2015-01-04 ENCOUNTER — Ambulatory Visit: Payer: Self-pay | Admitting: Pulmonary Disease

## 2015-01-06 ENCOUNTER — Ambulatory Visit
Admission: RE | Admit: 2015-01-06 | Discharge: 2015-01-06 | Disposition: A | Discharge status: Home / Self Care | Payer: PRIVATE HEALTH INSURANCE | Source: Ambulatory Visit | Attending: Neurosurgery | Admitting: Neurosurgery

## 2015-01-06 ENCOUNTER — Other Ambulatory Visit: Payer: Self-pay

## 2015-01-06 DIAGNOSIS — R251 Tremor, unspecified: Secondary | ICD-10-CM

## 2015-01-23 ENCOUNTER — Ambulatory Visit (INDEPENDENT_AMBULATORY_CARE_PROVIDER_SITE_OTHER): Payer: Self-pay | Admitting: Psychology

## 2015-01-23 DIAGNOSIS — F332 Major depressive disorder, recurrent severe without psychotic features: Secondary | ICD-10-CM

## 2015-01-23 DIAGNOSIS — F1010 Alcohol abuse, uncomplicated: Secondary | ICD-10-CM

## 2015-01-24 ENCOUNTER — Encounter: Payer: Self-pay | Admitting: Pulmonary Disease

## 2015-03-27 ENCOUNTER — Ambulatory Visit: Payer: Self-pay | Admitting: Pulmonary Disease

## 2015-03-29 ENCOUNTER — Other Ambulatory Visit: Payer: Self-pay | Admitting: Family Medicine

## 2015-04-22 ENCOUNTER — Ambulatory Visit: Payer: Self-pay | Admitting: Pulmonary Disease

## 2015-06-12 ENCOUNTER — Other Ambulatory Visit (INDEPENDENT_AMBULATORY_CARE_PROVIDER_SITE_OTHER): Payer: PRIVATE HEALTH INSURANCE

## 2015-06-12 DIAGNOSIS — Z0000 Encounter for general adult medical examination without abnormal findings: Secondary | ICD-10-CM

## 2015-06-12 LAB — BASIC METABOLIC PANEL
BUN: 14 mg/dL (ref 6–23)
CHLORIDE: 106 meq/L (ref 96–112)
CO2: 23 mEq/L (ref 19–32)
CREATININE: 1.23 mg/dL (ref 0.40–1.50)
Calcium: 9.6 mg/dL (ref 8.4–10.5)
GFR: 65.38 mL/min (ref >60.00)
Glucose, Bld: 97 mg/dL (ref 70–99)
Potassium: 4.3 mEq/L (ref 3.5–5.1)
Sodium: 138 mEq/L (ref 135–145)

## 2015-06-12 LAB — POC URINALSYSI DIPSTICK (AUTOMATED)
Bilirubin, UA: NEGATIVE
Blood, UA: NEGATIVE
Glucose, UA: NEGATIVE
KETONES UA: NEGATIVE
LEUKOCYTES UA: NEGATIVE
Nitrite, UA: NEGATIVE
PH UA: 6.5
PROTEIN UA: NEGATIVE
SPEC GRAV UA: 1.02
UROBILINOGEN UA: 0.2

## 2015-06-12 LAB — CBC WITH DIFFERENTIAL/PLATELET
BASOS PCT: 0.6 % (ref 0.0–3.0)
Basophils Absolute: 0 10*3/uL (ref 0.0–0.1)
Eosinophils Absolute: 0.1 10*3/uL (ref 0.0–0.7)
Eosinophils Relative: 1.3 % (ref 0.0–5.0)
HEMATOCRIT: 42.1 % (ref 39.0–52.0)
HEMOGLOBIN: 14.7 g/dL (ref 13.0–17.0)
LYMPHS PCT: 28.7 % (ref 12.0–46.0)
Lymphs Abs: 1.3 10*3/uL (ref 0.7–4.0)
MCHC: 34.9 g/dL (ref 30.0–36.0)
MCV: 93.1 fl (ref 78.0–100.0)
Monocytes Absolute: 0.4 10*3/uL (ref 0.1–1.0)
Monocytes Relative: 7.7 % (ref 3.0–12.0)
Neutro Abs: 2.9 10*3/uL (ref 1.4–7.7)
Neutrophils Relative %: 61.7 % (ref 43.0–77.0)
Platelets: 219 10*3/uL (ref 150.0–400.0)
RBC: 4.52 Mil/uL (ref 4.22–5.81)
RDW: 13.8 % (ref 11.5–15.5)
WBC: 4.7 10*3/uL (ref 4.0–10.5)

## 2015-06-12 LAB — HEPATIC FUNCTION PANEL
ALT: 20 U/L (ref 0–53)
AST: 18 U/L (ref 0–37)
Albumin: 4.4 g/dL (ref 3.5–5.2)
Alkaline Phosphatase: 47 U/L (ref 39–117)
Bilirubin, Direct: 0.1 mg/dL (ref 0.0–0.3)
Total Bilirubin: 0.7 mg/dL (ref 0.2–1.2)
Total Protein: 6.8 g/dL (ref 6.0–8.3)

## 2015-06-12 LAB — LIPID PANEL
Cholesterol: 191 mg/dL (ref 0–200)
HDL: 58 mg/dL (ref >39.00)
LDL Cholesterol: 116 mg/dL — ABNORMAL HIGH (ref 0–99)
NonHDL: 133.06
Total CHOL/HDL Ratio: 3
Triglycerides: 83 mg/dL (ref 0.0–149.0)
VLDL: 16.6 mg/dL (ref 0.0–40.0)

## 2015-06-12 LAB — TSH: TSH: 1.61 u[IU]/mL (ref 0.35–4.50)

## 2015-06-12 LAB — PSA: PSA: 0.38 ng/mL (ref 0.10–4.00)

## 2015-06-18 ENCOUNTER — Encounter: Payer: Self-pay | Admitting: Family Medicine

## 2015-06-25 ENCOUNTER — Ambulatory Visit (INDEPENDENT_AMBULATORY_CARE_PROVIDER_SITE_OTHER): Payer: PRIVATE HEALTH INSURANCE | Admitting: Family Medicine

## 2015-06-25 ENCOUNTER — Encounter: Payer: Self-pay | Admitting: Family Medicine

## 2015-06-25 VITALS — BP 140/76 | HR 65 | Temp 97.8°F | Ht 75.0 in | Wt 217.0 lb

## 2015-06-25 DIAGNOSIS — Z23 Encounter for immunization: Secondary | ICD-10-CM | POA: Diagnosis not present

## 2015-06-25 DIAGNOSIS — I10 Essential (primary) hypertension: Secondary | ICD-10-CM

## 2015-06-25 DIAGNOSIS — Z0000 Encounter for general adult medical examination without abnormal findings: Secondary | ICD-10-CM

## 2015-06-25 DIAGNOSIS — F329 Major depressive disorder, single episode, unspecified: Secondary | ICD-10-CM

## 2015-06-25 DIAGNOSIS — N401 Enlarged prostate with lower urinary tract symptoms: Secondary | ICD-10-CM | POA: Insufficient documentation

## 2015-06-25 DIAGNOSIS — F32A Depression, unspecified: Secondary | ICD-10-CM

## 2015-06-25 DIAGNOSIS — R351 Nocturia: Secondary | ICD-10-CM

## 2015-06-25 MED ORDER — VALACYCLOVIR HCL 500 MG PO TABS: 500.0000 mg | ORAL_TABLET | Freq: Two times a day (BID) | ORAL | Status: DC

## 2015-06-25 MED ORDER — BUPROPION HCL ER (XL) 300 MG PO TB24: ORAL_TABLET | ORAL | Status: DC

## 2015-06-25 NOTE — Assessment & Plan Note (Signed)
had titrated celexa down to 10mg  previously and now off. Also on wellbutrin 300mg . PHq2 of 0 and no SI/HI

## 2015-06-25 NOTE — Patient Instructions (Addendum)
We will try to remember Hepatitis C with next physical labs- you can request this  Glad depression is doing well- continue wellbutrin alone.   Blood pressure hair high. Did repeat but unfortunately that was after your rectal so slightly higher. As long as home readings in 130s lets continue to follow. Other option would be a low dose of amlodipine or lisinopril or hydrochlorothiazide.   Gave you the shingles shot today. As for now- good for a lifetime. Should reduce your risk about 50%

## 2015-06-25 NOTE — Assessment & Plan Note (Signed)
S: controlled at home in 130s on no medication. Slightly high here BP Readings from Last 3 Encounters:  06/25/15 140/76  12/04/14 148/88  02/15/14 130/82  A/P:Continue home monitoring. Apparently has been ok at [facility] physicals as well. Just restarted his exercise within a month so may trend down as well

## 2015-06-25 NOTE — Progress Notes (Signed)
Curtis Reddish, MD Phone: [phone]  Subjective:  Patient presents today for their annual physical. Chief complaint-noted.   See problem oriented charting- ROS- full  review of systems was completed and negative including No chest pain or shortness of breath. No headache or blurry vision.   The following were reviewed and entered/updated in epic: Past Medical History  Diagnosis Date  . Hypertension   . Radiculopathy, lumbar region   . Arthritis   . Personal history of colonic adenomas 02/23/2013    02/23/2013 6 small polyps removed, 5 recovered - all adenomas repeat colonoscopy 02/2016    . Fracture of rib of left side 04/2013  . Hearing difficulty     left ear-since college. high frequency loss.    Patient Active Problem List   Diagnosis Date Noted  . OSA (obstructive sleep apnea) 02/15/2014    Priority: Medium  . Depression 11/07/2013    Priority: Medium  . Oral herpes simplex infection 11/07/2013    Priority: Medium  . Hyperlipidemia 09/09/2007    Priority: Medium  . Essential hypertension 06/10/2007    Priority: Medium  . BPH associated with nocturia 06/25/2015    Priority: Low  . Insomnia 12/18/2013    Priority: Low  . Personal history of colonic adenomas 02/23/2013    Priority: Low  . Chronic prostatitis 09/09/2007    Priority: Low  . CHEST PAIN UNSPECIFIED 07/19/2007    Priority: Low   Past Surgical History  Procedure Laterality Date  . Lumbar lamninectomy      l4-l5 for drop foot  . Tonsillectomy    . Wisdom tooth extraction    . Tympanostomy      tubes as child    Family History  Problem Relation Age of Onset  . Kidney cancer Brother   . Colon cancer Neg Hx   . Transient ischemic attack Mother     age 26    Medications- reviewed and updated Current Outpatient Prescriptions  Medication Sig Dispense Refill  . buPROPion (WELLBUTRIN XL) 300 MG 24 hr tablet TAKE 1 TABLET (300 MG TOTAL) BY MOUTH AT BEDTIME. 90 tablet 3  . KRILL OIL 1000 MG CAPS  Take 2,000 mg by mouth 2 (two) times daily.     . multivitamin (ONE-A-DAY MEN'S) TABS Take 1 tablet by mouth daily.     . valACYclovir (VALTREX) 500 MG tablet Take 1 tablet (500 mg total) by mouth 2 (two) times daily. 180 tablet 3   No current facility-administered medications for this visit.    Allergies-reviewed and updated No Known Allergies  Social History   Social History  . Marital Status: Married    Spouse Name: N/A  . Number of Children: N/A  . Years of Education: N/A   Occupational History  . [employer]    Social History Main Topics  . Smoking status: Never Smoker   . Smokeless tobacco: None  . Alcohol Use: 0.0 oz/week    0 Standard drinks or equivalent per week     Comment: intermittent once a month  . Drug Use: No  . Sexual Activity: Yes   Other Topics Concern  . None   Social History Narrative   Married. 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 Oropharynx normal Neck: no thyromegaly CV: RRR no murmurs rubs or gallops Lungs: CTAB no crackles, wheeze, rhonchi Abdomen: soft/nontender/nondistended/normal bowel sounds. No rebound or guarding.  Rectal: normal tone, diffusely enlarged prostate, no masses or tenderness Ext: no edema Skin: warm, dry Neuro: grossly normal, moves all extremities, PERRLA  Assessment/Plan:  55 y.o. male presenting for annual physical.  Health Maintenance counseling: 1. Anticipatory guidance: Patient counseled regarding regular dental exams, eye exams, wearing seatbelts.  2. Risk factor reduction:  Advised  patient of need for regular exercise and diet rich and fruits and vegetables to reduce risk of heart attack and stroke. Started exercise at [employer] and has been doing well there.  3. Immunizations/screenings/ancillary studies Health Maintenance Due  Topic Date Due  . Hepatitis C Screening - next visit 15-Apr-1960   4. Prostate cancer screening- low risk based off rectal and PSA   Lab Results  Component Value Date   PSA 0.38 06/12/2015   PSA 0.48 03/30/2014   PSA 0.61 01/02/2014   5. Colon cancer screening - 02/23/2013 with 3 year follow up in december 6. Skin cancer screening- [place] [facility] regularly Dr. Ronnald Ramp yearly Also gets physicals with [facility] every 3 years.   Essential hypertension S: controlled at home in 130s on no medication. Slightly high here BP Readings from Last 3 Encounters:  06/25/15 140/76  12/04/14 148/88  02/15/14 130/82  A/P:Continue home monitoring. Apparently has been ok at [facility] physicals as well. Just restarted his exercise within a month so may trend down as well  Depression had titrated celexa down to 10mg  previously and now off. Also on wellbutrin 300mg . PHq2 of 0 and no SI/HI     Return in about 1 year (around 06/24/2016) for physical. Return precautions advised.   Orders Placed This Encounter  Procedures  . Varicella-zoster vaccine subcutaneous  He understands insurance will not cover this, agrees to pay out of pocket. Roughly quoted 200-300. He declined to wait until quote.   Meds ordered this encounter  Medications  . buPROPion (WELLBUTRIN XL) 300 MG 24 hr tablet    Sig: TAKE 1 TABLET (300 MG TOTAL) BY MOUTH AT BEDTIME.    Dispense:  90 tablet    Refill:  3  . valACYclovir (VALTREX) 500 MG tablet    Sig: Take 1 tablet (500 mg total) by mouth 2 (two) times daily.    Dispense:  180 tablet    Refill:  3

## 2015-07-31 ENCOUNTER — Encounter: Payer: Self-pay | Admitting: Pulmonary Disease

## 2015-12-26 ENCOUNTER — Telehealth: Payer: Self-pay | Admitting: Family Medicine

## 2015-12-26 NOTE — Telephone Encounter (Signed)
Yes thanks, that's fine- not a high risk med
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
## 2015-12-27 ENCOUNTER — Telehealth: Payer: Self-pay

## 2015-12-27 NOTE — Telephone Encounter (Signed)
Called and spoke to pharmacy and gave permission for an early refill of Wellbutrin.

## 2015-12-27 NOTE — Telephone Encounter (Signed)
Called pharmacy and gave the ok for an early refill

## 2016-02-18 ENCOUNTER — Ambulatory Visit (INDEPENDENT_AMBULATORY_CARE_PROVIDER_SITE_OTHER): Payer: PRIVATE HEALTH INSURANCE

## 2016-02-18 ENCOUNTER — Encounter: Payer: Self-pay | Admitting: Internal Medicine

## 2016-02-18 DIAGNOSIS — Z23 Encounter for immunization: Secondary | ICD-10-CM | POA: Diagnosis not present

## 2016-02-25 ENCOUNTER — Encounter: Payer: Self-pay | Admitting: Internal Medicine

## 2016-04-08 ENCOUNTER — Ambulatory Visit (AMBULATORY_SURGERY_CENTER): Payer: Self-pay | Admitting: *Deleted

## 2016-04-08 VITALS — Ht 74.0 in | Wt 221.0 lb

## 2016-04-08 DIAGNOSIS — Z860100 Personal history of colon polyps, unspecified: Secondary | ICD-10-CM

## 2016-04-14 ENCOUNTER — Encounter: Payer: Self-pay | Admitting: Internal Medicine

## 2016-04-14 ENCOUNTER — Ambulatory Visit (AMBULATORY_SURGERY_CENTER): Payer: PRIVATE HEALTH INSURANCE | Admitting: Internal Medicine

## 2016-04-14 VITALS — BP 123/80 | HR 61 | Temp 98.6°F | Resp 13 | Ht 74.0 in | Wt 221.0 lb

## 2016-04-14 DIAGNOSIS — D122 Benign neoplasm of ascending colon: Secondary | ICD-10-CM

## 2016-04-14 DIAGNOSIS — D123 Benign neoplasm of transverse colon: Secondary | ICD-10-CM

## 2016-04-14 DIAGNOSIS — K621 Rectal polyp: Secondary | ICD-10-CM | POA: Diagnosis not present

## 2016-04-14 DIAGNOSIS — D128 Benign neoplasm of rectum: Secondary | ICD-10-CM | POA: Diagnosis not present

## 2016-04-14 DIAGNOSIS — D125 Benign neoplasm of sigmoid colon: Secondary | ICD-10-CM

## 2016-04-14 DIAGNOSIS — Z86010 Personal history of colonic polyps: Secondary | ICD-10-CM | POA: Diagnosis not present

## 2016-04-14 HISTORY — PX: COLONOSCOPY: SHX174

## 2016-04-14 MED ORDER — SODIUM CHLORIDE 0.9 % IV SOLN: 500.0000 mL | INTRAVENOUS | Status: DC

## 2016-04-14 NOTE — Progress Notes (Signed)
Called to room to assist during endoscopic procedure.  Patient ID and intended procedure confirmed with present staff. Received instructions for my participation in the procedure from the performing physician.  

## 2016-04-14 NOTE — Patient Instructions (Addendum)
I found and removed 5 polyps - all look benign. I will let you know pathology results and when to have another routine colonoscopy by mail.  I appreciate the opportunity to care for you. Gatha Mayer, MD, FACG   YOU HAD AN ENDOSCOPIC PROCEDURE TODAY AT [facility]:   Refer to the procedure report that was given to you for any specific questions about what was found during the examination.  If the procedure report does not answer your questions, please call your gastroenterologist to clarify.  If you requested that your care partner not be given the details of your procedure findings, then the procedure report has been included in a sealed envelope for you to review at your convenience later.  YOU SHOULD EXPECT: Some feelings of bloating in the abdomen. Passage of more gas than usual.  Walking can help get rid of the air that was put into your GI tract during the procedure and reduce the bloating. If you had a lower endoscopy (such as a colonoscopy or flexible sigmoidoscopy) you may notice spotting of blood in your stool or on the toilet paper. If you underwent a bowel prep for your procedure, you may not have a normal bowel movement for a few days.  Please Note:  You might notice some irritation and congestion in your nose or some drainage.  This is from the oxygen used during your procedure.  There is no need for concern and it should clear up in a day or so.  SYMPTOMS TO REPORT IMMEDIATELY:   Following lower endoscopy (colonoscopy or flexible sigmoidoscopy):  Excessive amounts of blood in the stool  Significant tenderness or worsening of abdominal pains  Swelling of the abdomen that is new, acute  Fever of 100F or higher   Please read all handouts given to you by your recovery nurse. For urgent or emergent issues, a gastroenterologist can be reached at any hour by calling [phone].   DIET:  We do recommend a small meal at first, but then you may  proceed to your regular diet.  Drink plenty of fluids but you should avoid alcoholic beverages for 24 hours.  ACTIVITY:  You should plan to take it easy for the rest of today and you should NOT DRIVE or use heavy machinery until tomorrow (because of the sedation medicines used during the test).    FOLLOW UP: Our staff will call the number listed on your records the next business day following your procedure to check on you and address any questions or concerns that you may have regarding the information given to you following your procedure. If we do not reach you, we will leave a message.  However, if you are feeling well and you are not experiencing any problems, there is no need to return our call.  We will assume that you have returned to your regular daily activities without incident.  If any biopsies were taken you will be contacted by phone or by letter within the next 1-3 weeks.  Please call us at [phone] if you have not heard about the biopsies in 3 weeks.    SIGNATURES/CONFIDENTIALITY: You and/or your care partner have signed paperwork which will be entered into your electronic medical record.  These signatures attest to the fact that that the information above on your After Visit Summary has been reviewed and is understood.  Full responsibility of the confidentiality of this discharge information lies with you and/or your care-partner.  Thank  you for letting us take care of your healthcare needs today. 

## 2016-04-14 NOTE — Op Note (Signed)
Curtis Hendrix Procedure Date: 04/14/2016 2:12 PM MRN: [id number] Endoscopist: Gatha Mayer , MD Age: 56 Referring MD:  Date of Birth: [birth year] Gender: Male Account #: [id number] Procedure:                Colonoscopy Indications:              Surveillance: Personal history of adenomatous                            polyps on last colonoscopy 3 years ago Medicines:                Propofol per Anesthesia, Monitored Anesthesia Care Procedure:                Pre-Anesthesia Assessment:                           - Prior to the procedure, a History and Physical                            was performed, and patient medications and                            allergies were reviewed. The patient's tolerance of                            previous anesthesia was also reviewed. The risks                            and benefits of the procedure and the sedation                            options and risks were discussed with the patient.                            All questions were answered, and informed consent                            was obtained. Prior Anticoagulants: The patient has                            taken no previous anticoagulant or antiplatelet                            agents. ASA Grade Assessment: III - A patient with                            severe systemic disease. After reviewing the risks                            and benefits, the patient was deemed in                            satisfactory condition to undergo the procedure.  After obtaining informed consent, the colonoscope                            was passed under direct vision. Throughout the                            procedure, the patient's blood pressure, pulse, and                            oxygen saturations were monitored continuously. The                            Model CF-HQ190L [id number]) scope was introduced   through the anus and advanced to the the cecum,                            identified by appendiceal orifice and ileocecal                            valve. The colonoscopy was performed without                            difficulty. The patient tolerated the procedure                            well. The quality of the bowel preparation was                            excellent. The bowel preparation used was Miralax.                            The ileocecal valve, appendiceal orifice, and                            rectum were photographed. Scope In: 2:18:48 PM Scope Out: 2:38:07 PM Scope Withdrawal Time: 0 hours 16 minutes 26 seconds  Total Procedure Duration: 0 hours 19 minutes 19 seconds  Findings:                 The perianal and digital rectal examinations were                            normal. Pertinent negatives include normal prostate                            (size, shape, and consistency).                           Five sessile polyps were found in the rectum,                            sigmoid colon, transverse colon and ascending                            colon. The polyps were 3 to 8 mm  in size. These                            polyps were removed with a cold snare. Resection                            and retrieval were complete. Verification of                            patient identification for the specimen was done.                            Estimated blood loss was minimal.                           A few small-mouthed diverticula were found in the                            sigmoid colon.                           The exam was otherwise without abnormality on                            direct and retroflexion views. Complications:            No immediate complications. Estimated Blood Loss:     Estimated blood loss was minimal. Impression:               - Five 3 to 8 mm polyps in the rectum, in the                            sigmoid colon, in the transverse  colon and in the                            ascending colon, removed with a cold snare.                            Resected and retrieved.                           - The examination was otherwise normal on direct                            and retroflexion views.                           - Personal history of colonic polyps. 5 adenomas                            02/2013 Recommendation:           - Patient has a contact number available for                            emergencies. The signs and symptoms of potential  delayed complications were discussed with the                            patient. Return to normal activities tomorrow.                            Written discharge instructions were provided to the                            patient.                           - Resume previous diet.                           - Continue present medications.                           - Repeat colonoscopy is recommended for                            surveillance. The colonoscopy date will be                            determined after pathology results from today's                            exam become available for review. Gatha Mayer, MD 04/14/2016 2:47:00 PM This report has been signed electronically.

## 2016-04-14 NOTE — Progress Notes (Signed)
To recovery vss report RN Judson Roch

## 2016-04-15 ENCOUNTER — Telehealth: Payer: Self-pay

## 2016-04-15 NOTE — Telephone Encounter (Signed)
  Follow up Call-  Call back number 04/14/2016  Post procedure Call Back phone  # [phone]  Permission to leave phone message Yes  Some recent data might be hidden    Patient was called for follow up after his procedure on 04/14/2016. No answer at the number given for follow up phone call. Mail box was full so I could not leave a message.

## 2016-04-23 ENCOUNTER — Encounter: Payer: Self-pay | Admitting: Internal Medicine

## 2016-04-23 DIAGNOSIS — Z86010 Personal history of colonic polyps: Secondary | ICD-10-CM

## 2016-04-23 NOTE — Progress Notes (Signed)
5 adenomas max 8 mm recall 2021

## 2016-07-01 ENCOUNTER — Other Ambulatory Visit: Payer: Self-pay | Admitting: Family Medicine

## 2016-07-06 ENCOUNTER — Other Ambulatory Visit (INDEPENDENT_AMBULATORY_CARE_PROVIDER_SITE_OTHER): Payer: PRIVATE HEALTH INSURANCE

## 2016-07-06 DIAGNOSIS — N401 Enlarged prostate with lower urinary tract symptoms: Secondary | ICD-10-CM

## 2016-07-06 DIAGNOSIS — Z0000 Encounter for general adult medical examination without abnormal findings: Secondary | ICD-10-CM | POA: Diagnosis not present

## 2016-07-06 DIAGNOSIS — I10 Essential (primary) hypertension: Secondary | ICD-10-CM

## 2016-07-06 DIAGNOSIS — R351 Nocturia: Secondary | ICD-10-CM

## 2016-07-06 DIAGNOSIS — E7800 Pure hypercholesterolemia, unspecified: Secondary | ICD-10-CM

## 2016-07-06 DIAGNOSIS — Z1159 Encounter for screening for other viral diseases: Secondary | ICD-10-CM

## 2016-07-06 LAB — CBC
HCT: 43.2 % (ref 39.0–52.0)
Hemoglobin: 15 g/dL (ref 13.0–17.0)
MCHC: 34.8 g/dL (ref 30.0–36.0)
MCV: 96.4 fl (ref 78.0–100.0)
Platelets: 186 10*3/uL (ref 150.0–400.0)
RBC: 4.48 Mil/uL (ref 4.22–5.81)
RDW: 13.6 % (ref 11.5–15.5)
WBC: 3.8 10*3/uL — AB (ref 4.0–10.5)

## 2016-07-06 LAB — COMPREHENSIVE METABOLIC PANEL
ALK PHOS: 45 U/L (ref 39–117)
ALT: 22 U/L (ref 0–53)
AST: 19 U/L (ref 0–37)
Albumin: 4.3 g/dL (ref 3.5–5.2)
BILIRUBIN TOTAL: 0.7 mg/dL (ref 0.2–1.2)
BUN: 13 mg/dL (ref 6–23)
CO2: 26 mEq/L (ref 19–32)
Calcium: 9.5 mg/dL (ref 8.4–10.5)
Chloride: 104 mEq/L (ref 96–112)
Creatinine, Ser: 0.87 mg/dL (ref 0.40–1.50)
GFR: 97.11 mL/min (ref >60.00)
GLUCOSE: 96 mg/dL (ref 70–99)
POTASSIUM: 4.2 meq/L (ref 3.5–5.1)
SODIUM: 138 meq/L (ref 135–145)
TOTAL PROTEIN: 6.6 g/dL (ref 6.0–8.3)

## 2016-07-06 LAB — POC URINALSYSI DIPSTICK (AUTOMATED)
Bilirubin, UA: NEGATIVE
Glucose, UA: NEGATIVE
KETONES UA: NEGATIVE
LEUKOCYTES UA: NEGATIVE
Nitrite, UA: NEGATIVE
PH UA: 6 (ref 5.0–8.0)
PROTEIN UA: NEGATIVE
RBC UA: NEGATIVE
SPEC GRAV UA: 1.025 (ref 1.010–1.025)
Urobilinogen, UA: 0.2 E.U./dL

## 2016-07-06 LAB — LIPID PANEL
Cholesterol: 216 mg/dL — ABNORMAL HIGH (ref 0–200)
HDL: 57.8 mg/dL (ref >39.00)
LDL Cholesterol: 131 mg/dL — ABNORMAL HIGH (ref 0–99)
NONHDL: 157.89
Total CHOL/HDL Ratio: 4
Triglycerides: 136 mg/dL (ref 0.0–149.0)
VLDL: 27.2 mg/dL (ref 0.0–40.0)

## 2016-07-06 LAB — PSA: PSA: 0.6 ng/mL (ref 0.10–4.00)

## 2016-07-07 LAB — HEPATITIS C ANTIBODY: HCV AB: NEGATIVE

## 2016-07-13 ENCOUNTER — Encounter: Payer: Self-pay | Admitting: Family Medicine

## 2016-07-13 ENCOUNTER — Ambulatory Visit (INDEPENDENT_AMBULATORY_CARE_PROVIDER_SITE_OTHER): Payer: PRIVATE HEALTH INSURANCE | Admitting: Family Medicine

## 2016-07-13 VITALS — BP 132/88 | HR 63 | Temp 98.0°F | Ht 75.0 in | Wt 212.6 lb

## 2016-07-13 DIAGNOSIS — Z0000 Encounter for general adult medical examination without abnormal findings: Secondary | ICD-10-CM

## 2016-07-13 DIAGNOSIS — Z23 Encounter for immunization: Secondary | ICD-10-CM | POA: Diagnosis not present

## 2016-07-13 MED ORDER — BUPROPION HCL ER (XL) 300 MG PO TB24: ORAL_TABLET | ORAL | 3 refills | Status: DC

## 2016-07-13 MED ORDER — VALACYCLOVIR HCL 500 MG PO TABS: 500.0000 mg | ORAL_TABLET | Freq: Every day | ORAL | 3 refills | Status: DC

## 2016-07-13 NOTE — Progress Notes (Signed)
Pre visit review using our clinic review tool, if applicable. No additional management support is needed unless otherwise documented below in the visit note. 

## 2016-07-13 NOTE — Progress Notes (Signed)
Phone: [phone]  Subjective:  Patient presents today for their annual physical. Chief complaint-noted.   See problem oriented charting- ROS- full  review of systems was completed and negative except for: nocturia as noted below. No chest pain or shortness of breath. No headache or blurry vision.   The following were reviewed and entered/updated in epic: Past Medical History:  Diagnosis Date  . Arthritis   . Fracture of rib of left side 04/2013  . Hearing difficulty    left ear-since college. high frequency loss.   . Hypertension   . Personal history of colonic adenomas 02/23/2013   02/23/2013 6 small polyps removed, 5 recovered - all adenomas repeat colonoscopy 02/2016    . Radiculopathy, lumbar region    Patient Active Problem List   Diagnosis Date Noted  . OSA (obstructive sleep apnea) 02/15/2014    Priority: Medium  . Depression 11/07/2013    Priority: Medium  . Oral herpes simplex infection 11/07/2013    Priority: Medium  . Hyperlipidemia 09/09/2007    Priority: Medium  . Essential hypertension 06/10/2007    Priority: Medium  . BPH associated with nocturia 06/25/2015    Priority: Low  . Insomnia 12/18/2013    Priority: Low  . History of colonic polyps 02/23/2013    Priority: Low  . Chronic prostatitis 09/09/2007    Priority: Low  . CHEST PAIN UNSPECIFIED 07/19/2007    Priority: Low   Past Surgical History:  Procedure Laterality Date  . COLONOSCOPY    . lumbar lamninectomy  2008   l4-l5 for drop foot  . TONSILLECTOMY    . TYMPANOSTOMY     tubes as child  . WISDOM TOOTH EXTRACTION      Family History  Problem Relation Age of Onset  . Transient ischemic attack Mother     age 64  . Kidney cancer Brother   . Colon cancer Neg Hx     Medications- reviewed and updated Current Outpatient Prescriptions  Medication Sig Dispense Refill  . buPROPion (WELLBUTRIN XL) 300 MG 24 hr tablet TAKE 1 TABLET (300 MG TOTAL) BY MOUTH EACH MORNING 91 tablet 3  .  multivitamin (ONE-A-DAY MEN'S) TABS Take 1 tablet by mouth daily.     . valACYclovir (VALTREX) 500 MG tablet Take 1 tablet (500 mg total) by mouth daily. 91 tablet 3   No current facility-administered medications for this visit.     Allergies-reviewed and updated No Known Allergies  Social History   Social History  . Marital status: Married    Spouse name: N/A  . Number of children: N/A  . Years of education: N/A   Occupational History  . [employer]    Social History Main Topics  . Smoking status: Never Smoker  . Smokeless tobacco: Never Used  . Alcohol use 3.6 oz/week    6 Cans of beer per week  . Drug use: No  . Sexual activity: Yes   Other Topics Concern  . Not on file   Social History Narrative   Married. 2 daughters (22 and 21-both at 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 Oropharynx normal Neck: no thyromegaly CV: RRR no murmurs rubs or gallops Lungs: CTAB no crackles, wheeze, rhonchi Abdomen: soft/nontender/nondistended/normal bowel sounds. No rebound or guarding.  Ext: no edema Skin: warm, dry Neuro: grossly normal, moves all extremities, PERRLA Rectal: normal tone, normal sized prostate, no masses or tenderness  Assessment/Plan:  56 y.o. male presenting for annual physical.  Health Maintenance counseling: 1. Anticipatory guidance: Patient counseled regarding regular dental exams q 4 months, eye exams - no changes- no recent checks, wearing seatbelts.  2. Risk factor reduction:  Advised patient of need for regular exercise and diet rich and  fruits and vegetables to reduce risk of heart attack and stroke. Exercise- exercise 4-5 days a week. Diet-mediterranean type diet.  Wt Readings from Last 3 Encounters:  07/13/16 212 lb 9.6 oz (96.4 kg)  04/14/16 221 lb (100.2 kg)  04/08/16 221 lb (100.2 kg)  3. Immunizations/screenings/ancillary studies- offered shingrix - will start today and repeat 2-3 months with nurse visit Immunization History  Administered Date(s) Administered  . Influenza Split 12/28/2011  . Influenza Whole 03/17/2003  . Influenza,inj,Quad PF,36+ Mos 02/15/2014, 12/04/2014, 02/18/2016  . Td 03/17/1999, 06/29/2008  . Zoster 06/25/2015  . Zoster Recombinat (Shingrix) 07/13/2016  4. Prostate cancer screening-  low risk based off rectal exam and PSA trend.  Some bph on exam and nocturia once a night- may restart his saw palmetto Lab Results  Component Value Date   PSA 0.60 07/06/2016   PSA 0.38 06/12/2015   PSA 0.48 03/30/2014   5. Colon cancer screening - 03/2016 with 3 year follow up advised through Dr. Carlean Purl 6. Skin cancer screening- [facility] with Dr. Ronnald Ramp yearly  Sees [facility] for executive physicals every 3 years. Spring 2019.   Status of chronic or acute concerns   S: controlled on home checks in the past (at [facility])- usually 120s over 80s.  Recheck DBP - and controlled BP Readings from Last 3 Encounters:  07/13/16 132/88  04/14/16 123/80  06/25/15 140/76  A/P:Continue without medicines- continue regular exercise and healthy eating. Down 9 lbs- great effort  Depression- pHQ2 of 0 on wellbutrin 300mg  XR. Some winter blues.   Herpes- on prophylaxis with twice daily valtrex for cold sores- actually only doing once a day  OSA- sleeps on his side and does ok- did not tolerate CPAP  HLD- 10 year risk 5.2%- may start aspirin, no statin for now Lab Results  Component Value Date   CHOL 216 (H) 07/06/2016   HDL 57.80 07/06/2016   LDLCALC 131 (H) 07/06/2016   LDLDIRECT 115.6 01/02/2014   TRIG 136.0  07/06/2016   CHOLHDL 4 07/06/2016   1 year CPE. 2-3 month shingrix repeat  Orders Placed This Encounter  Procedures  . Varicella-zoster vaccine IM    Meds ordered this encounter  Medications  . buPROPion (WELLBUTRIN XL) 300 MG 24 hr tablet    Sig: TAKE 1 TABLET (300 MG TOTAL) BY MOUTH EACH MORNING    Dispense:  91 tablet    Refill:  3  . valACYclovir (VALTREX) 500 MG tablet    Sig: Take 1 tablet (500 mg total) by mouth daily.    Dispense:  91 tablet    Refill:  3    Return precautions advised.   Garret Reddish, MD

## 2016-07-13 NOTE — Patient Instructions (Addendum)
Start shingrix series with repeat nurse visit in 2-3 months.   Could take aspirin 81mg  to lower cardiac risk. Your 10 year risk is only 5.2% for heart attack or stroke. Current guidelines say 10% to start this but we are opting to be more aggressive  Can restart saw palmetto

## 2016-09-03 ENCOUNTER — Ambulatory Visit (INDEPENDENT_AMBULATORY_CARE_PROVIDER_SITE_OTHER): Payer: PRIVATE HEALTH INSURANCE | Admitting: Psychology

## 2016-09-03 DIAGNOSIS — F1010 Alcohol abuse, uncomplicated: Secondary | ICD-10-CM

## 2016-09-03 DIAGNOSIS — F332 Major depressive disorder, recurrent severe without psychotic features: Secondary | ICD-10-CM | POA: Diagnosis not present

## 2016-09-22 ENCOUNTER — Ambulatory Visit (INDEPENDENT_AMBULATORY_CARE_PROVIDER_SITE_OTHER): Payer: PRIVATE HEALTH INSURANCE

## 2016-09-22 DIAGNOSIS — Z23 Encounter for immunization: Secondary | ICD-10-CM

## 2016-10-19 ENCOUNTER — Encounter: Payer: Self-pay | Admitting: Family Medicine

## 2016-10-19 ENCOUNTER — Encounter (INDEPENDENT_AMBULATORY_CARE_PROVIDER_SITE_OTHER): Payer: Self-pay

## 2016-11-27 ENCOUNTER — Ambulatory Visit (INDEPENDENT_AMBULATORY_CARE_PROVIDER_SITE_OTHER): Payer: PRIVATE HEALTH INSURANCE | Admitting: Psychology

## 2016-11-27 DIAGNOSIS — F1010 Alcohol abuse, uncomplicated: Secondary | ICD-10-CM | POA: Diagnosis not present

## 2016-11-27 DIAGNOSIS — F332 Major depressive disorder, recurrent severe without psychotic features: Secondary | ICD-10-CM

## 2017-01-21 ENCOUNTER — Ambulatory Visit: Payer: PRIVATE HEALTH INSURANCE | Admitting: Psychology

## 2017-02-26 ENCOUNTER — Ambulatory Visit (INDEPENDENT_AMBULATORY_CARE_PROVIDER_SITE_OTHER): Payer: PRIVATE HEALTH INSURANCE | Admitting: Psychology

## 2017-02-26 DIAGNOSIS — F332 Major depressive disorder, recurrent severe without psychotic features: Secondary | ICD-10-CM | POA: Diagnosis not present

## 2017-02-26 DIAGNOSIS — F1010 Alcohol abuse, uncomplicated: Secondary | ICD-10-CM

## 2017-04-07 ENCOUNTER — Telehealth: Payer: Self-pay | Admitting: Family Medicine

## 2017-04-07 NOTE — Telephone Encounter (Signed)
Copied from [place]. Topic: Quick Communication - See Telephone Encounter >> Apr 07, 2017  4:07 PM Bea Graff, NT wrote: CRM for notification. See Telephone encounter for: Pt got his buPROPion (WELLBUTRIN XL) filled but states he accidentally threw the medication away when he was getting [facility]. He leaves for [place] or Friday and wants to see if this can be called back in. [facility] [address]   04/07/17.

## 2017-04-08 ENCOUNTER — Encounter: Payer: Self-pay | Admitting: Family Medicine

## 2017-04-08 MED ORDER — BUPROPION HCL ER (XL) 300 MG PO TB24: ORAL_TABLET | ORAL | 3 refills | Status: DC

## 2017-04-08 NOTE — Telephone Encounter (Signed)
See note

## 2017-04-08 NOTE — Telephone Encounter (Signed)
Patient said he really needs to have this re filled today as he leaves early in the am. Please advise.

## 2017-04-08 NOTE — Telephone Encounter (Signed)
Pt calling again he leaves in the morning and needs this rx.

## 2017-04-08 NOTE — Telephone Encounter (Signed)
Please advise. Patient leaves on Friday.

## 2017-04-08 NOTE — Telephone Encounter (Signed)
I refilled and sent him mychart message  Roselyn Reef and Modena Nunnery- we are having issues with the [facility] messages- I am getting them over 24 hours later. Can you look into what is happening here? This is too long of a delay. For phone messages needing my attention I would like to have access to them same day

## 2017-06-29 ENCOUNTER — Other Ambulatory Visit: Payer: Self-pay | Admitting: Family Medicine

## 2017-06-29 ENCOUNTER — Other Ambulatory Visit: Payer: Self-pay

## 2017-06-29 MED ORDER — VALACYCLOVIR HCL 500 MG PO TABS: 500.0000 mg | ORAL_TABLET | Freq: Every day | ORAL | 0 refills | Status: DC

## 2018-03-31 ENCOUNTER — Other Ambulatory Visit: Payer: Self-pay | Admitting: Family Medicine

## 2018-04-27 ENCOUNTER — Other Ambulatory Visit: Payer: Self-pay | Admitting: Family Medicine

## 2018-04-27 NOTE — Telephone Encounter (Signed)
Left message to return phone call.

## 2018-04-27 NOTE — Telephone Encounter (Signed)
CRM created. 

## 2018-04-27 NOTE — Telephone Encounter (Signed)
Patient need to schedule an ov for more refills. 

## 2018-04-29 NOTE — Telephone Encounter (Signed)
Patient need to schedule an ov for more refills. 

## 2018-04-29 NOTE — Telephone Encounter (Signed)
Left message to return phone call.

## 2018-05-17 ENCOUNTER — Encounter: Payer: Self-pay | Admitting: Family Medicine

## 2018-05-17 ENCOUNTER — Ambulatory Visit (INDEPENDENT_AMBULATORY_CARE_PROVIDER_SITE_OTHER): Payer: PRIVATE HEALTH INSURANCE | Admitting: Family Medicine

## 2018-05-17 DIAGNOSIS — B002 Herpesviral gingivostomatitis and pharyngotonsillitis: Secondary | ICD-10-CM

## 2018-05-17 DIAGNOSIS — E785 Hyperlipidemia, unspecified: Secondary | ICD-10-CM

## 2018-05-17 DIAGNOSIS — F325 Major depressive disorder, single episode, in full remission: Secondary | ICD-10-CM | POA: Diagnosis not present

## 2018-05-17 MED ORDER — VALACYCLOVIR HCL 500 MG PO TABS: 500.0000 mg | ORAL_TABLET | Freq: Every day | ORAL | 3 refills | Status: DC

## 2018-05-17 MED ORDER — BUPROPION HCL ER (XL) 300 MG PO TB24: 300.0000 mg | ORAL_TABLET | Freq: Every day | ORAL | 3 refills | Status: DC

## 2018-05-17 NOTE — Patient Instructions (Addendum)
Schedule a fall physical- lets give you some time to work on healthy eating and continued excellent exercise. Id be interested in [facility]'s opinion on statin therapy given 65th percentile on coronary CT scan.

## 2018-05-17 NOTE — Progress Notes (Signed)
Phone [phone]   Subjective:  Curtis Hendrix is a 58 y.o. year old very pleasant male patient who presents for/with See problem oriented charting ROS-no chest pain or shortness of breath reported.  No edema.  Some agitation at times.  Admits to high stress with work  Past Medical History-  Patient Active Problem List   Diagnosis Date Noted  . OSA (obstructive sleep apnea) 02/15/2014    Priority: Medium  . Depression 11/07/2013    Priority: Medium  . Oral herpes simplex infection 11/07/2013    Priority: Medium  . Hyperlipidemia 09/09/2007    Priority: Medium  . Essential hypertension 06/10/2007    Priority: Medium  . BPH associated with nocturia 06/25/2015    Priority: Low  . Insomnia 12/18/2013    Priority: Low  . History of colonic polyps 02/23/2013    Priority: Low  . Chronic prostatitis 09/09/2007    Priority: Low  . CHEST PAIN UNSPECIFIED 07/19/2007    Priority: Low    Medications- reviewed and updated Current Outpatient Medications  Medication Sig Dispense Refill  . buPROPion (WELLBUTRIN XL) 300 MG 24 hr tablet Take 1 tablet (300 mg total) by mouth daily. 90 tablet 3  . multivitamin (ONE-A-DAY MEN'S) TABS Take 1 tablet by mouth daily.     . valACYclovir (VALTREX) 500 MG tablet Take 1 tablet (500 mg total) by mouth daily. 90 tablet 3   No current facility-administered medications for this visit.      Objective:  BP 138/82   Pulse 71   Temp 98 F (36.7 C) (Oral)   Ht 6\' 3"  (1.905 m)   Wt 215 lb 6.4 oz (97.7 kg)   SpO2 98%   BMI 26.92 kg/m  Gen: NAD, resting comfortably CV: RRR no murmurs rubs or gallops Lungs: CTAB no crackles, wheeze, rhonchi Abdomen: soft/nontender/nondistended/normal bowel sounds. No rebound or guarding.  Ext: no edema Skin: warm, dry Neuro: Gait and speech normal    Assessment and Plan   Depression S: patient remains on wellbutrin 300mg  XR. Doing well. No SI.    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 Some irritability. Trying to exercise to balance himself out. Has done some counseling- sees Dr. Cheryln Manly intermittently- going to try to get back in.  A/P: reasonable control but with stress levels/anxiety with GAD7 of 8- recommended he add counseling back in with Dr. Cheryln Manly which he is already planning. With irritability- discussed possible ssri bu topted in the end to stick with wellbutrin to lower sexual and weight gain side effects  Oral herpes simplex infection S: patient doing well on once a day valtrex with no recent outbreaks A/P: continue current therapy- well controlled. Refilled today  Hyperlipidemia S: poorly controlled on last check. We calculated 10 year risk using labs from 2018 from below as well as [facility] labs from 05/2017 and 10 year risk 6.5% in both cases.   He also had [facility] with [facility] which placed him at 65% for age.  Lab Results  Component Value Date   CHOL 216 (H) 07/06/2016   HDL 57.80 07/06/2016   LDLCALC 131 (H) 07/06/2016   LDLDIRECT 115.6 01/02/2014   TRIG 136.0 07/06/2016   CHOLHDL 4 07/06/2016   A/P:  With his lipids alone reviewed would not recommend statin given only 6.5% 10 year risk. In light of 65% per age based on coronary  CT- I told him I leaned more toward statin as this suggests to me a higher risk. He wants to focus on healthy eating (discussed whole foods plant based diet) and regular exercises (already doing 5 days a week) and repeat lipids in 6 months at CPE- he is also going to reach back out to [employer] executive CPE program for their opinion.   Discussed aspirin if 10 year risk gets above 10%   Other notes: 1.home Bps remain 120s/80s in past but recently- doesn't check.  Systolic high normal today. Weight up 3 lbs from last visit. Will reevaluate next visit  Return in about 6 months (around 11/17/2018) for physical.  Lab/Order associations: Major  depressive disorder with single episode, in full remission ([place])  Oral herpes simplex infection  Hyperlipidemia, unspecified hyperlipidemia type  Meds ordered this encounter  Medications  . buPROPion (WELLBUTRIN XL) 300 MG 24 hr tablet    Sig: Take 1 tablet (300 mg total) by mouth daily.    Dispense:  90 tablet    Refill:  3  . valACYclovir (VALTREX) 500 MG tablet    Sig: Take 1 tablet (500 mg total) by mouth daily.    Dispense:  90 tablet    Refill:  3   Return precautions advised.  Garret Reddish, MD

## 2018-05-17 NOTE — Assessment & Plan Note (Signed)
S: patient doing well on once a day valtrex with no recent outbreaks A/P: continue current therapy- well controlled. Refilled today

## 2018-05-17 NOTE — Assessment & Plan Note (Signed)
S: patient remains on wellbutrin 300mg  XR. Doing well. No SI.    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 Trying to exercise to balance himself out. Has done some counseling- sees Dr. Cheryln Manly intermittently- going to try to get back in.  A/P: reasonable control but with stress levels/anxiety with GAD7 of 8- recommended he add counseling back in with Dr. Cheryln Manly which he is already planning. With irritability- discussed possible ssri bu topted in the end to stick with wellbutrin to lower sexual and weight gain side effects

## 2018-05-17 NOTE — Assessment & Plan Note (Addendum)
S: poorly controlled on last check. We calculated 10 year risk using labs from 2018 from below as well as [facility] labs from 05/2017 and 10 year risk 6.5% in both cases.   He also had coronary CT scan with [facility] which placed him at 65% for age.  Lab Results  Component Value Date   CHOL 216 (H) 07/06/2016   HDL 57.80 07/06/2016   LDLCALC 131 (H) 07/06/2016   LDLDIRECT 115.6 01/02/2014   TRIG 136.0 07/06/2016   CHOLHDL 4 07/06/2016   A/P:  With his lipids alone reviewed would not recommend statin given only 6.5% 10 year risk. In light of 65% per age based on coronary CT- I told him I leaned more toward statin as this suggests to me a higher risk. He wants to focus on healthy eating (discussed whole foods plant based diet) and regular exercises (already doing 5 days a week) and repeat lipids in 6 months at CPE- he is also going to reach back out to [employer] executive CPE program for their opinion.   Discussed aspirin if 10 year risk gets above 10%

## 2018-06-06 ENCOUNTER — Ambulatory Visit: Payer: PRIVATE HEALTH INSURANCE | Admitting: Psychology

## 2018-08-25 ENCOUNTER — Telehealth: Payer: Self-pay

## 2018-08-25 ENCOUNTER — Other Ambulatory Visit: Payer: Self-pay | Admitting: Physical Therapy

## 2018-08-25 MED ORDER — VALACYCLOVIR HCL 500 MG PO TABS: 500.0000 mg | ORAL_TABLET | Freq: Every day | ORAL | 3 refills | Status: DC

## 2018-08-25 MED ORDER — BUPROPION HCL ER (XL) 300 MG PO TB24: 300.0000 mg | ORAL_TABLET | Freq: Every day | ORAL | 3 refills | Status: DC

## 2018-08-25 NOTE — Telephone Encounter (Signed)
Called pharmacy and they verified that a new order will need to be placed so refills electronically ordered to [facility]

## 2018-08-25 NOTE — Telephone Encounter (Signed)
Copied from [place] [id number]. Topic: Quick Communication - Rx Refill/Question >> Aug 25, 2018  7:56 AM Carolyn Stare wrote: Medication  buPROPion (WELLBUTRIN XL) 300 MG 24 hr tablet            valACYclovir (VALTREX) 500 MG table  Pt did not pick up the RX in time so they put the medication back and now the pharmacy req the office to call in the refill   Preferred Pharmacy  [facility]   Agent: Please be advised that RX refills may take up to 3 business days. We ask that you follow-up with your pharmacy.

## 2018-10-12 ENCOUNTER — Encounter: Payer: Self-pay | Admitting: Family Medicine

## 2018-10-12 ENCOUNTER — Telehealth: Payer: Self-pay | Admitting: Family Medicine

## 2018-10-12 DIAGNOSIS — R509 Fever, unspecified: Secondary | ICD-10-CM

## 2018-10-12 NOTE — Telephone Encounter (Signed)
Patient called in wanting to get tested for the antibody test and wants it sent to [facility]. Patient wants this antibody [id number] and wants to do it [facility]. Please advise patient would like a call back.

## 2018-10-14 NOTE — Telephone Encounter (Signed)
Spoke to pt and asked about past sx of covid. Pt stated he traveled to another stated some months ago and stayed with a friend. Pt stated that when he returned home he was notified that friend was positive for Covid-19. PT stated that he and his spouse developed fever and cough but nothing more. Pt stated an ov was not made bc he was told to watch for sx but he let it " run its course". Labs placed order will be printed for pt to pick up.

## 2018-10-14 NOTE — Addendum Note (Signed)
Addended by: Gwenyth Ober R on: 10/14/2018 02:15 PM   Modules accepted: Orders

## 2018-10-17 NOTE — Telephone Encounter (Signed)
See MyChart message

## 2018-10-25 ENCOUNTER — Encounter: Payer: Self-pay | Admitting: Family Medicine

## 2018-10-25 ENCOUNTER — Other Ambulatory Visit: Payer: Self-pay

## 2018-10-25 ENCOUNTER — Ambulatory Visit (INDEPENDENT_AMBULATORY_CARE_PROVIDER_SITE_OTHER): Payer: Commercial Managed Care - PPO | Admitting: Family Medicine

## 2018-10-25 VITALS — BP 138/92 | HR 62 | Temp 98.5°F | Ht 75.0 in | Wt 210.4 lb

## 2018-10-25 DIAGNOSIS — K625 Hemorrhage of anus and rectum: Secondary | ICD-10-CM

## 2018-10-25 DIAGNOSIS — Z125 Encounter for screening for malignant neoplasm of prostate: Secondary | ICD-10-CM

## 2018-10-25 DIAGNOSIS — R030 Elevated blood-pressure reading, without diagnosis of hypertension: Secondary | ICD-10-CM | POA: Diagnosis not present

## 2018-10-25 DIAGNOSIS — R509 Fever, unspecified: Secondary | ICD-10-CM

## 2018-10-25 DIAGNOSIS — E785 Hyperlipidemia, unspecified: Secondary | ICD-10-CM

## 2018-10-25 DIAGNOSIS — Z23 Encounter for immunization: Secondary | ICD-10-CM

## 2018-10-25 LAB — POCT URINALYSIS DIPSTICK
Bilirubin, UA: NEGATIVE
Blood, UA: NEGATIVE
Glucose, UA: NEGATIVE
Ketones, UA: NEGATIVE
Leukocytes, UA: NEGATIVE
Nitrite, UA: NEGATIVE
Protein, UA: NEGATIVE
Spec Grav, UA: <=1.005 — AB (ref 1.010–1.025)
Urobilinogen, UA: 0.2 E.U./dL
pH, UA: 6.5 (ref 5.0–8.0)

## 2018-10-25 LAB — HEMOCCULT GUIAC POC 1CARD (OFFICE): Fecal Occult Blood, POC: POSITIVE — AB

## 2018-10-25 NOTE — Progress Notes (Signed)
Phone [phone]   Subjective:  Curtis Hendrix is a 58 y.o. year old very pleasant male patient who presents for/with See problem oriented charting Chief Complaint  Patient presents with  . Rectal Bleeding   ROS-no chest pain/shortness of breath/fever/chills/cough reported.  No abnormal fatigue  Past Medical History-  Patient Active Problem List   Diagnosis Date Noted  . OSA (obstructive sleep apnea) 02/15/2014    Priority: Medium  . Depression 11/07/2013    Priority: Medium  . Oral herpes simplex infection 11/07/2013    Priority: Medium  . Hyperlipidemia 09/09/2007    Priority: Medium  . Essential hypertension 06/10/2007    Priority: Medium  . BPH associated with nocturia 06/25/2015    Priority: Low  . Insomnia 12/18/2013    Priority: Low  . History of colonic polyps 02/23/2013    Priority: Low  . Chronic prostatitis 09/09/2007    Priority: Low  . CHEST PAIN UNSPECIFIED 07/19/2007    Priority: Low    Medications- reviewed and updated Current Outpatient Medications  Medication Sig Dispense Refill  . buPROPion (WELLBUTRIN XL) 300 MG 24 hr tablet Take 1 tablet (300 mg total) by mouth daily. 90 tablet 3  . multivitamin (ONE-A-DAY MEN'S) TABS Take 1 tablet by mouth daily.     . valACYclovir (VALTREX) 500 MG tablet Take 1 tablet (500 mg total) by mouth daily. 90 tablet 3   No current facility-administered medications for this visit.      Objective:  BP (!) 138/92 (BP Location: Left Arm, Patient Position: Sitting, Cuff Size: Normal)   Pulse 62   Temp 98.5 F (36.9 C) (Oral)   Ht 6\' 3"  (1.905 m)   Wt 210 lb 6.4 oz (95.4 kg)   SpO2 98%   BMI 26.30 kg/m  Gen: NAD, resting comfortably CV: RRR no murmurs rubs or gallops Lungs: CTAB no crackles, wheeze, rhonchi Abdomen: soft/nontender/nondistended/normal bowel sounds. No rebound or guarding.  MSK: L patient with some tenderness to palpation lateral to left flank-just below the lowest rib Ext: no edema Skin: warm,  dry Neuro: grossly normal, moves all extremities Rectal: Externally noted a small hemorrhoid tag approximately 5:00.  Internally no obvious hemorrhoids noted but anoscopy was not performed- Hemoccult was positive    Results for orders placed or performed in visit on 10/25/18 (from the past 24 hour(s))  POCT urinalysis dipstick     Status: Abnormal   Collection Time: 10/25/18  4:42 PM  Result Value Ref Range   Color, UA Yellow    Clarity, UA Clear    Glucose, UA Negative Negative   Bilirubin, UA Negative    Ketones, UA Negative    Spec Grav, UA <=1.005 (A) 1.010 - 1.025   Blood, UA Negative    pH, UA 6.5 5.0 - 8.0   Protein, UA Negative Negative   Urobilinogen, UA 0.2 0.2 or 1.0 E.U./dL   Nitrite, UA Negative    Leukocytes, UA Negative Negative   Appearance     Odor    POCT Occult Blood Stool     Status: Abnormal   Collection Time: 10/25/18  5:08 PM  Result Value Ref Range   Fecal Occult Blood, POC Positive (A) Negative   Card #1 Date 10/25/2018         Assessment and Plan   # Rectal Bleeding S: Sx started this past Sunday night. Denies abd pain, dark blood, tar-like or coffee ground appearing stool, fever. Blood is pink/bright red. Blood was free floating in toilet.  C/o dull left sided flank pain intermittently x 3-4 weeks abotu 2/10 off and on. Father recently hx with bladder and kidney cancer. Brother had renal cell cancer.   Had small hemorrhoid last week when on fishing trip. Had some straining- about pinky size. External small hemorrhoid did not open. On Friday had noted when had to urinate almost immediately needed to have BM.   BMI Sunday with blood and Monday. BM today without blood.  A/P: I strongly suspect this was hemorrhoid related bleeding.  Did not have bleeding with bowel movement today though Hemoccult was slightly positive.  He is already planned to have a colonoscopy in January- we agreed to move this up only if has recurrent rectal bleeding or significant  drop in hemoglobin-CBC will be completed today  In regards to pain lateral to left CVA area- urinalysis reassuring today-patient well-hydrated.  Does have family history of renal cell carcinoma-we will get BMP.  Pain appears to be musculoskeletal-potentially related to the muscle surrounding the ribs given pain with palpation of the rib today-do not feel strongly about rib films unless worsening or more persistent  #Elevated blood pressure S: One-time elevated reading today-when I repeated was 140/90 BP Readings from Last 3 Encounters:  10/25/18 (!) 138/92  05/17/18 138/82  07/13/16 132/88  A/P: Poor control today but first time elevation of last 3 visits- we will repeat at follow-up and if remains elevated consider medication  #hyperlipidemia S: Poorly controlled on no statin Lab Results  Component Value Date   CHOL 216 (H) 07/06/2016   HDL 57.80 07/06/2016   LDLCALC 131 (H) 07/06/2016   LDLDIRECT 115.6 01/02/2014   TRIG 136.0 07/06/2016   CHOLHDL 4 07/06/2016   A/P: No recent lipids here-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  Ten-year risk based off prior lipids was 6.5%-will recalculate with lipids today nonfasting  #Prior fever-previously ordered COVID-19 antibody testing-that will be completed today   Recommended follow up: As needed for this acute issue if recurrent or hemoglobin drops  Lab/Order associations: Not fasting   ICD-10-CM   1. Hyperlipidemia, unspecified hyperlipidemia type  E78.5 CBC    Comprehensive metabolic panel    Lipid panel    POCT urinalysis dipstick  2. Rectal bleeding  K62.5 CBC  3. Screening for prostate cancer  Z12.5 PSA  4. Fever, unspecified fever cause  R50.9 SAR CoV2 Serology (COVID 19)AB(IGG)IA    CANCELED: SARS-CoV-2 Antibodies  5. Need for Tdap vaccination  Z23 Tdap vaccine greater than or equal to 7yo IM    Time Stamp The duration of face-to-face time during this visit was greater than 25 minutes. Greater than  50% of this time was spent in counseling, explanation of diagnosis, planning of further management, and/or coordination of care including discussing potential causes of GI bleeds, discussing risks of GI bleeds, discussing that strongly suspect hemorrhoids as cause, discussing follow-up indications, reviewing prior colonoscopy together.     Return precautions advised.  Garret Reddish, MD

## 2018-10-25 NOTE — Patient Instructions (Addendum)
Health Maintenance Due  Topic Date Due  . TETANUS/TDAP - done today.  06/30/2018  . INFLUENZA VACCINE We should have flu shots available by September. Please strongly consider getting flu shot this year. If you get your flu shot at a pharmacy- please let us know.   10/15/2018   Strongly suspect hemorrhoid bleeding especially knowing you had an external hemorrhoid last week.  Get blood work to make sure no significant drop in blood counts- let me know if you have recurrent bleeding.

## 2018-10-26 LAB — CBC
HCT: 41 % (ref 39.0–52.0)
Hemoglobin: 14 g/dL (ref 13.0–17.0)
MCHC: 34.2 g/dL (ref 30.0–36.0)
MCV: 99.3 fl (ref 78.0–100.0)
Platelets: 190 10*3/uL (ref 150.0–400.0)
RBC: 4.13 Mil/uL — ABNORMAL LOW (ref 4.22–5.81)
RDW: 12.5 % (ref 11.5–15.5)
WBC: 4.9 10*3/uL (ref 4.0–10.5)

## 2018-10-26 LAB — LIPID PANEL
Cholesterol: 182 mg/dL (ref 0–200)
HDL: 53.6 mg/dL (ref >39.00)
LDL Cholesterol: 98 mg/dL (ref 0–99)
NonHDL: 128.76
Total CHOL/HDL Ratio: 3
Triglycerides: 155 mg/dL — ABNORMAL HIGH (ref 0.0–149.0)
VLDL: 31 mg/dL (ref 0.0–40.0)

## 2018-10-26 LAB — COMPREHENSIVE METABOLIC PANEL
ALT: 27 U/L (ref 0–53)
AST: 24 U/L (ref 0–37)
Albumin: 4.4 g/dL (ref 3.5–5.2)
Alkaline Phosphatase: 51 U/L (ref 39–117)
BUN: 11 mg/dL (ref 6–23)
CO2: 24 mEq/L (ref 19–32)
Calcium: 9.2 mg/dL (ref 8.4–10.5)
Chloride: 101 mEq/L (ref 96–112)
Creatinine, Ser: 0.95 mg/dL (ref 0.40–1.50)
GFR: 81.85 mL/min (ref >60.00)
Glucose, Bld: 91 mg/dL (ref 70–99)
Potassium: 4.4 mEq/L (ref 3.5–5.1)
Sodium: 135 mEq/L (ref 135–145)
Total Bilirubin: 0.7 mg/dL (ref 0.2–1.2)
Total Protein: 6.6 g/dL (ref 6.0–8.3)

## 2018-10-26 LAB — PSA: PSA: 0.43 ng/mL (ref 0.10–4.00)

## 2018-10-26 LAB — SAR COV2 SEROLOGY (COVID19)AB(IGG),IA: SARS CoV2 AB IGG: POSITIVE — AB

## 2018-10-28 ENCOUNTER — Telehealth: Payer: Self-pay

## 2018-10-28 NOTE — Telephone Encounter (Deleted)
Received a Skype from front office staff stating " I have a Hunter pt on the phone who needs to be triaged for high blood sugar    her reading was 457"  Per Dr.Hunter verbally pt needs to go to local UC. Called pt to Triage and ask here to recheck her sugar readings. Pt stated she is on her way to work and cannot check her sugar readings. Pt stated she does not have a glucometer. I advised  When was the last time she checked it pt stated yesterday. Was rushed off the phone and pt stated she will go to the UC when she gets off at 4pm.        

## 2018-10-28 NOTE — Addendum Note (Signed)
Addended by: Gwenyth Ober R on: 10/28/2018 12:30 PM   Modules accepted: Orders

## 2018-10-28 NOTE — Telephone Encounter (Signed)
Called pt and scheduled appt. For Monday. No further action needed!

## 2018-10-31 ENCOUNTER — Other Ambulatory Visit: Payer: Self-pay

## 2018-10-31 ENCOUNTER — Other Ambulatory Visit (INDEPENDENT_AMBULATORY_CARE_PROVIDER_SITE_OTHER): Payer: Commercial Managed Care - PPO

## 2018-10-31 ENCOUNTER — Telehealth: Payer: Self-pay

## 2018-10-31 DIAGNOSIS — K625 Hemorrhage of anus and rectum: Secondary | ICD-10-CM | POA: Diagnosis not present

## 2018-10-31 LAB — CBC
HCT: 42.6 % (ref 39.0–52.0)
Hemoglobin: 14.8 g/dL (ref 13.0–17.0)
MCHC: 34.6 g/dL (ref 30.0–36.0)
MCV: 99.1 fl (ref 78.0–100.0)
Platelets: 198 10*3/uL (ref 150.0–400.0)
RBC: 4.3 Mil/uL (ref 4.22–5.81)
RDW: 12.4 % (ref 11.5–15.5)
WBC: 2.5 10*3/uL — ABNORMAL LOW (ref 4.0–10.5)

## 2018-10-31 NOTE — Telephone Encounter (Signed)
Spoke with pt while he was in office for labs. He said that he had sleep study done through [facility] a few years ago and was provided with a CPAP but he did not keep it because it was uncomfortable. He would like to know if it would be possible to have order for new CPAP. This was previously provided through [facility].

## 2018-10-31 NOTE — Telephone Encounter (Signed)
Would probably be best to follow-up with Dr. Elsworth Soho so not only this can be ordered but it can be titrated if needed.  Typically CPAP supplies require a follow-up visit for advanced home care to accept the order- likely could schedule a virtual visit to discuss I believe.

## 2018-11-01 ENCOUNTER — Encounter: Payer: Self-pay | Admitting: Family Medicine

## 2018-11-01 NOTE — Telephone Encounter (Signed)
Replied to pt via [place].

## 2018-11-03 ENCOUNTER — Encounter: Payer: Self-pay | Admitting: Family Medicine

## 2018-11-07 NOTE — Telephone Encounter (Signed)
Dr. Yong Channel,  Pt sent message with concerns about wbc. He is concerned d/t family hx of cancer, dad has bladder and kidney cancer and brother has kidney cancer.   He was made aware that you would be back in the office today and would like a response regarding his abnormal lab value and family hx.   Thanks!  Brandy/CMA

## 2019-01-15 ENCOUNTER — Encounter: Payer: Self-pay | Admitting: Family Medicine

## 2019-01-16 ENCOUNTER — Telehealth: Payer: Self-pay | Admitting: Pulmonary Disease

## 2019-01-16 ENCOUNTER — Ambulatory Visit (INDEPENDENT_AMBULATORY_CARE_PROVIDER_SITE_OTHER): Payer: Commercial Managed Care - PPO | Admitting: Pulmonary Disease

## 2019-01-16 ENCOUNTER — Other Ambulatory Visit: Payer: Self-pay

## 2019-01-16 ENCOUNTER — Encounter: Payer: Self-pay | Admitting: Pulmonary Disease

## 2019-01-16 DIAGNOSIS — G4733 Obstructive sleep apnea (adult) (pediatric): Secondary | ICD-10-CM

## 2019-01-16 MED ORDER — BUPROPION HCL ER (XL) 300 MG PO TB24: 300.0000 mg | ORAL_TABLET | Freq: Every day | ORAL | 3 refills | Status: DC

## 2019-01-16 MED ORDER — VALACYCLOVIR HCL 500 MG PO TABS: 500.0000 mg | ORAL_TABLET | Freq: Every day | ORAL | 3 refills | Status: DC

## 2019-01-16 NOTE — Telephone Encounter (Signed)
Patient had OV. Nothing further needed at this time.

## 2019-01-16 NOTE — Progress Notes (Signed)
Subjective:    Patient ID: Curtis Hendrix, male    DOB: [birth year], 58 y.o.   MRN: [id number]  HPI   Chief Complaint  Patient presents with   Consult    last seen 11/2014.  Here to re-establish care for OSA.  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  He was diagnosed in 2016 with moderate OSA, tried CPAP with nasal pillows but did not tolerate this and after a few weeks return the device. His wife is complain about loud snoring and his family had to leave the bedroom.  He was on a fishing trip more recently and has body who has OSA commented on his loud snoring and long-term complications of untreated OSA.  His weight has decreased from 221 4 years ago to 213 now. Epworth sleepiness score is 4 and he only reports sleepiness sometimes while lying down to rest in the afternoons.  Will occasionally take an afternoon nap [place].  Bedtime is around 10 PM, sleep latency is about 20 minutes, he sleeps on his side with 1 pillow, snoring is worse when supine, reports 1-2 nocturnal awakenings including nocturia and is out of bed by 6 AM feeling rested without dryness of mouth or headaches.  He takes 1 cup of coffee in the mornings which keeps him going through the day.  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    Hypertension    Personal history of colonic  adenomas 02/23/2013   02/23/2013 6 small polyps removed, 5 recovered - all adenomas repeat colonoscopy 02/2016     Radiculopathy, lumbar region     Past Surgical History:  Procedure Laterality Date   COLONOSCOPY     lumbar lamninectomy  2008   l4-l5 for drop foot   TONSILLECTOMY     TYMPANOSTOMY     tubes as child   WISDOM TOOTH EXTRACTION      No Known Allergies  Social History   Socioeconomic History   Marital status: Married    Spouse name: Not on file   Number of children: Not on file   Years of education: Not on file   Highest education level: Not on file  Occupational History   Occupation: [employer]  Social Needs   Financial resource strain: Not on file   Food insecurity    Worry: Not on file    Inability: Not on file   Transportation needs    Medical: Not on file    Non-medical: Not on file  Tobacco Use   Smoking status: Never Smoker   Smokeless tobacco: Never Used  Substance and Sexual Activity   Alcohol use: Yes    Alcohol/week: 6.0 standard drinks    Types: 6 Cans of beer per week   Drug use: No   Sexual activity: Yes  Lifestyle   Physical activity    Days per week:  Not on file    Minutes per session: Not on file   Stress: Not on file  Relationships   Social connections    Talks on phone: Not on file    Gets together: Not on file    Attends religious service: Not on file    Active member of club or organization: Not on file    Attends meetings of clubs or organizations: Not on file    Relationship status: Not on file   Intimate partner violence    Fear of current or ex partner: Not on file    Emotionally abused: Not on file    Physically abused: Not on file    Forced sexual activity: Not on file  Other Topics Concern   Not on file  Social History Narrative   Married. 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[employer])      Hobbies: exercise, hunting-deer hunt, fishing-offshore fish, travel- islands and [place]         Family History  Problem Relation Age of Onset   Transient ischemic attack Mother        age 38   Bladder Cancer Father    Kidney cancer Father    Kidney cancer Brother        renal cell carcinoma age 32- blood in urine   Colon cancer Neg Hx      Review of Systems Constitutional: negative for anorexia, fevers and sweats  Eyes: negative for irritation, redness and visual disturbance  Ears, nose, mouth, throat, and face: negative for earaches, epistaxis, nasal congestion and sore throat  Respiratory: negative for cough, dyspnea on exertion, sputum and wheezing  Cardiovascular: negative for chest pain, dyspnea, lower extremity edema, orthopnea, palpitations and syncope  Gastrointestinal: negative for abdominal pain, constipation, diarrhea, melena, nausea and vomiting  Genitourinary:negative for dysuria, frequency and hematuria  Hematologic/lymphatic: negative for bleeding, easy bruising and lymphadenopathy  Musculoskeletal:negative for arthralgias, muscle weakness and stiff joints  Neurological: negative for coordination problems, gait problems, headaches and weakness  Endocrine: negative for diabetic symptoms including polydipsia, polyuria and weight loss     Objective:   Physical Exam  Gen. Pleasant, well-nourished, in no distress, normal affect ENT - no pallor,icterus, no post nasal drip, mild overbite Neck: No JVD, no thyromegaly, no carotid bruits Lungs: no use of accessory muscles, no dullness to percussion, clear without rales or rhonchi  Cardiovascular: Rhythm regular, heart sounds  normal, no murmurs or gallops, no peripheral edema Abdomen: soft and non-tender, no hepatosplenomegaly, BS normal. Musculoskeletal: No deformities, no cyanosis or clubbing Neuro:  alert, non focal       Assessment & Plan:

## 2019-01-16 NOTE — Patient Instructions (Signed)
Schedule home sleep test. Based on this we will write you a prescription for auto CPAP machine with nasal pillows

## 2019-01-16 NOTE — Assessment & Plan Note (Signed)
Schedule home sleep test. Based on this we will write you a prescription for auto CPAP machine with nasal pillows If he absolutely does not tolerate, then can trial oral appliance with dentist-but would like to hold off for now. We also discussed inspire device and he is not interested due to surgical complications   The pathophysiology of obstructive sleep apnea , it's cardiovascular consequences & modes of treatment including CPAP were discused with the patient in detail & they evidenced understanding. Discussed compliance with goal of at least 4-6 hrs every night is the expectation. Advised against medications with sedative side effects Cautioned against driving when sleepy - understanding that sleepiness will vary on a day to day basis

## 2019-01-16 NOTE — Telephone Encounter (Signed)
LMTCB   Spoke with Dr. Elsworth Soho. As long as patient is not having any symptoms okay to keep his OV.

## 2019-02-07 NOTE — Telephone Encounter (Signed)
PCC's please advise on scheduling pt's HST. Thanks.

## 2019-02-14 NOTE — Telephone Encounter (Signed)
Per [name] we are not scheduling home sleep studies at this time due to increase in covid cases.  We will contact Curtis Hendrix once we can schedule them again.  Will route to triage so Curtis Hendrix can be made aware thru his MyChart.

## 2019-03-30 ENCOUNTER — Telehealth: Payer: Self-pay | Admitting: Pulmonary Disease

## 2019-03-30 NOTE — Telephone Encounter (Signed)
Home Sleep Test order was placed on 01/16/19. I left a message for the patient to call to schedule the HST on 01/31/19. He sent in a message on 02/07/19 by My Chart ? He was told that we had LVM for him and what number was best to call him back [phone]. I reply to his message giving him my number to call [phone]. He called me back while I was out sick.Then [name] stated not to schedule on HST as of 02/14/19. I left a message again on 02/28/19 and 03/21/2019 for him to call us back to schedule the HST

## 2019-03-30 NOTE — Telephone Encounter (Signed)
ok 

## 2019-06-02 ENCOUNTER — Ambulatory Visit: Payer: Commercial Managed Care - PPO | Attending: Internal Medicine

## 2019-06-02 DIAGNOSIS — Z23 Encounter for immunization: Secondary | ICD-10-CM

## 2019-06-02 NOTE — Progress Notes (Signed)
   Covid-19 Vaccination Clinic  Name:  Curtis Hendrix    MRN: [id number] DOB: 1960/09/14  06/02/2019  Mr. Curtis Hendrix was observed post Covid-19 immunization for 15 minutes without incident. He was provided with Vaccine Information Sheet and instruction to access the V-Safe system.   Curtis Hendrix was instructed to call 911 with any severe reactions post vaccine: Marland Kitchen Difficulty breathing  . Swelling of face and throat  . A fast heartbeat  . A bad rash all over body  . Dizziness and weakness   Immunizations Administered    Name Date Dose VIS Date Route   Pfizer COVID-19 Vaccine 06/02/2019  2:18 PM 0.3 mL 02/24/2019 Intramuscular   Manufacturer: [place]   Lot: [id number]   [place]: [id number]

## 2019-06-27 ENCOUNTER — Ambulatory Visit: Payer: Commercial Managed Care - PPO | Attending: Internal Medicine

## 2019-06-27 DIAGNOSIS — Z23 Encounter for immunization: Secondary | ICD-10-CM

## 2019-06-27 NOTE — Progress Notes (Signed)
   Covid-19 Vaccination Clinic  Name:  Curtis Hendrix    MRN: [id number] DOB: [birth year]  06/27/2019  [name] was observed post Covid-19 immunization for 15 minutes without incident. He was provided with Vaccine Information Sheet and instruction to access the V-Safe system.   [name] was instructed to call 911 with any severe reactions post vaccine: Marland Kitchen Difficulty breathing  . Swelling of face and throat  . A fast heartbeat  . A bad rash all over body  . Dizziness and weakness   Immunizations Administered    Name Date Dose VIS Date Route   Pfizer COVID-19 Vaccine 06/27/2019  3:21 PM 0.3 mL 02/24/2019 Intramuscular   Manufacturer: [place]   Lot: [id number]   [place]: [id number]

## 2019-07-31 ENCOUNTER — Encounter: Payer: Self-pay | Admitting: Internal Medicine

## 2019-08-16 ENCOUNTER — Encounter: Payer: Self-pay | Admitting: Internal Medicine

## 2019-08-18 ENCOUNTER — Telehealth: Payer: Self-pay | Admitting: Family Medicine

## 2019-08-18 DIAGNOSIS — Z125 Encounter for screening for malignant neoplasm of prostate: Secondary | ICD-10-CM

## 2019-08-18 DIAGNOSIS — D72819 Decreased white blood cell count, unspecified: Secondary | ICD-10-CM

## 2019-08-18 DIAGNOSIS — E785 Hyperlipidemia, unspecified: Secondary | ICD-10-CM

## 2019-08-18 DIAGNOSIS — Z0000 Encounter for general adult medical examination without abnormal findings: Secondary | ICD-10-CM

## 2019-08-18 DIAGNOSIS — I10 Essential (primary) hypertension: Secondary | ICD-10-CM

## 2019-08-18 NOTE — Telephone Encounter (Signed)
Orders placed, ok to call and schedule pt lab visit a few days prior to CPE.

## 2019-08-18 NOTE — Telephone Encounter (Signed)
Patient's secretary is calling in to schedule patient for a physical, which is set for 9/27 at 2:40 pm, is it okay for patient to come the day before to have blood work done?

## 2019-08-18 NOTE — Addendum Note (Signed)
Addended by: Marin Olp on: 08/18/2019 05:44 PM   Modules accepted: Orders

## 2019-08-18 NOTE — Telephone Encounter (Signed)
Ok to order labs for CPE?

## 2019-08-18 NOTE — Telephone Encounter (Signed)
Yes thanks- cbc diff, cmp, lipid, ua under hyperlipidemia. Psa under screen prostate cancer. Pathologist smear review under leukopenia

## 2019-09-12 ENCOUNTER — Encounter: Payer: Commercial Managed Care - PPO | Admitting: Internal Medicine

## 2019-10-03 ENCOUNTER — Encounter: Payer: Commercial Managed Care - PPO | Admitting: Internal Medicine

## 2019-11-09 ENCOUNTER — Ambulatory Visit (INDEPENDENT_AMBULATORY_CARE_PROVIDER_SITE_OTHER): Payer: Commercial Managed Care - PPO | Admitting: Pulmonary Disease

## 2019-11-09 ENCOUNTER — Other Ambulatory Visit: Payer: Self-pay

## 2019-11-09 ENCOUNTER — Encounter: Payer: Self-pay | Admitting: Pulmonary Disease

## 2019-11-09 VITALS — BP 130/80 | HR 64 | Temp 97.3°F | Ht 70.0 in | Wt 214.0 lb

## 2019-11-09 DIAGNOSIS — G4733 Obstructive sleep apnea (adult) (pediatric): Secondary | ICD-10-CM

## 2019-11-09 NOTE — Progress Notes (Signed)
   Subjective:    Patient ID: Curtis Hendrix, male    DOB: 1960/04/07, 59 y.o.   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  He was diagnosed in 2016 with moderate OSA, but was unable to tolerate.  He was reevaluated 01/2019, due to the Covid search, home sleep test was not scheduled and he never obtained the machine.  Wife now states that snoring is worse, he is also having some daytime fatigue and is ready to get started on the machine  Significant tests/ events reviewed  04/2014-wt 210 pounds- AHI 28/hour, predominant hypopneas, lowest desaturation to 85%, severe PLM some of which were associated with arousals  Review of Systems Patient denies significant dyspnea,cough, hemoptysis,  chest pain, palpitations, pedal edema, orthopnea, paroxysmal nocturnal dyspnea, lightheadedness, nausea, vomiting, abdominal or  leg pains      Objective:   Physical Exam  Gen. Pleasant, well-nourished, in no distress ENT - no thrush, no pallor/icterus,no post nasal drip Neck: No JVD, no thyromegaly, no carotid bruits Lungs: no use of accessory muscles, no dullness to percussion, clear without rales or rhonchi  Cardiovascular: Rhythm regular, heart sounds  normal, no murmurs or gallops, no peripheral edema Musculoskeletal: No deformities, no cyanosis or clubbing        Assessment & Plan:

## 2019-11-09 NOTE — Assessment & Plan Note (Signed)
We will proceed with prescription for auto CPAP 5 to 15 cm with nasal pillows to DME. Report will be checked in 6 weeks and settings fine-tune if needed. Do not feel the need for repeat testing at this time since clearly symptoms are worse   compliance with goal of at least 4-6 hrs every night is the expectation. Advised against medications with sedative side effects Cautioned against driving when sleepy - understanding that sleepiness will vary on a day to day basis

## 2019-11-09 NOTE — Patient Instructions (Signed)
Prescription for auto CPAP 5 to 15 cm with nasal pillows will be sent to DME. We will check a report in 6 weeks Call us for any issues or if you are unable to tolerate

## 2019-11-22 ENCOUNTER — Encounter: Payer: Commercial Managed Care - PPO | Admitting: Internal Medicine

## 2019-12-07 ENCOUNTER — Other Ambulatory Visit: Payer: Self-pay

## 2019-12-07 ENCOUNTER — Other Ambulatory Visit: Payer: Commercial Managed Care - PPO

## 2019-12-07 DIAGNOSIS — Z0000 Encounter for general adult medical examination without abnormal findings: Secondary | ICD-10-CM

## 2019-12-07 DIAGNOSIS — Z1152 Encounter for screening for COVID-19: Secondary | ICD-10-CM

## 2019-12-07 DIAGNOSIS — Z125 Encounter for screening for malignant neoplasm of prostate: Secondary | ICD-10-CM

## 2019-12-07 DIAGNOSIS — E785 Hyperlipidemia, unspecified: Secondary | ICD-10-CM

## 2019-12-07 DIAGNOSIS — D72819 Decreased white blood cell count, unspecified: Secondary | ICD-10-CM

## 2019-12-07 NOTE — Addendum Note (Signed)
Addended by: Milton Ferguson D on: 12/07/2019 09:58 AM   Modules accepted: Orders

## 2019-12-08 LAB — PATHOLOGIST SMEAR REVIEW

## 2019-12-08 LAB — COMPREHENSIVE METABOLIC PANEL
AG Ratio: 1.8 (calc) (ref 1.0–2.5)
ALT: 25 U/L (ref 9–46)
AST: 23 U/L (ref 10–35)
Albumin: 4.6 g/dL (ref 3.6–5.1)
Alkaline phosphatase (APISO): 46 U/L (ref 35–144)
BUN: 11 mg/dL (ref 7–25)
CO2: 27 mmol/L (ref 20–32)
Calcium: 9.6 mg/dL (ref 8.6–10.3)
Chloride: 99 mmol/L (ref 98–110)
Creat: 1 mg/dL (ref 0.70–1.33)
Globulin: 2.5 g/dL (calc) (ref 1.9–3.7)
Glucose, Bld: 77 mg/dL (ref 65–99)
Potassium: 4.1 mmol/L (ref 3.5–5.3)
Sodium: 134 mmol/L — ABNORMAL LOW (ref 135–146)
Total Bilirubin: 0.9 mg/dL (ref 0.2–1.2)
Total Protein: 7.1 g/dL (ref 6.1–8.1)

## 2019-12-08 LAB — LIPID PANEL
Cholesterol: 172 mg/dL (ref <200)
HDL: 60 mg/dL (ref >=40)
LDL Cholesterol (Calc): 92 mg/dL (calc)
Non-HDL Cholesterol (Calc): 112 mg/dL (calc) (ref <130)
Total CHOL/HDL Ratio: 2.9 (calc) (ref <5.0)
Triglycerides: 107 mg/dL (ref <150)

## 2019-12-08 LAB — CBC WITH DIFFERENTIAL/PLATELET
Absolute Monocytes: 557 cells/uL (ref 200–950)
Basophils Absolute: 60 cells/uL (ref 0–200)
Basophils Relative: 1.3 %
Eosinophils Absolute: 51 cells/uL (ref 15–500)
Eosinophils Relative: 1.1 %
HCT: 43.2 % (ref 38.5–50.0)
Hemoglobin: 14.9 g/dL (ref 13.2–17.1)
Lymphs Abs: 1297 cells/uL (ref 850–3900)
MCH: 34.5 pg — ABNORMAL HIGH (ref 27.0–33.0)
MCHC: 34.5 g/dL (ref 32.0–36.0)
MCV: 100 fL (ref 80.0–100.0)
MPV: 11.2 fL (ref 7.5–12.5)
Monocytes Relative: 12.1 %
Neutro Abs: 2636 cells/uL (ref 1500–7800)
Neutrophils Relative %: 57.3 %
Platelets: 245 10*3/uL (ref 140–400)
RBC: 4.32 10*6/uL (ref 4.20–5.80)
RDW: 12.2 % (ref 11.0–15.0)
Total Lymphocyte: 28.2 %
WBC: 4.6 10*3/uL (ref 3.8–10.8)

## 2019-12-08 LAB — SARS COV-2 SEROLOGY(COVID-19)AB(IGG,IGM),IMMUNOASSAY
SARS CoV-2 AB IgG: NEGATIVE
SARS CoV-2 IgM: NEGATIVE

## 2019-12-08 LAB — PSA: PSA: 0.48 ng/mL (ref <=4.0)

## 2019-12-10 NOTE — Progress Notes (Signed)
Phone: [phone]   Subjective:  Patient presents today for their annual physical. Chief complaint-noted.   See problem oriented charting- ROS- full  review of systems was completed and negative per completely evaluated ROS sheet  The following were reviewed and entered/updated in epic: Past Medical History:  Diagnosis Date  . Arthritis   . Fracture of rib of left side 04/2013  . Hearing difficulty    left ear-since college. high frequency loss.   . Hypertension   . Personal history of colonic adenomas 02/23/2013   02/23/2013 6 small polyps removed, 5 recovered - all adenomas repeat colonoscopy 02/2016    . Radiculopathy, lumbar region    Patient Active Problem List   Diagnosis Date Noted  . OSA (obstructive sleep apnea) 02/15/2014    Priority: Medium  . Depression 11/07/2013    Priority: Medium  . Oral herpes simplex infection 11/07/2013    Priority: Medium  . Hyperlipidemia 09/09/2007    Priority: Medium  . Essential hypertension 06/10/2007    Priority: Medium  . BPH associated with nocturia 06/25/2015    Priority: Low  . Insomnia 12/18/2013    Priority: Low  . History of colonic polyps 02/23/2013    Priority: Low  . Chronic prostatitis 09/09/2007    Priority: Low  . CHEST PAIN UNSPECIFIED 07/19/2007    Priority: Low   Past Surgical History:  Procedure Laterality Date  . COLONOSCOPY    . lumbar lamninectomy  2008   l4-l5 for drop foot  . TONSILLECTOMY    . TYMPANOSTOMY     tubes as child  . WISDOM TOOTH EXTRACTION      Family History  Problem Relation Age of Onset  . Transient ischemic attack Mother        age 73  . Bladder Cancer Father   . Kidney cancer Father   . Kidney cancer Brother        renal cell carcinoma age 63- blood in urine  . Colon cancer Neg Hx     Medications- reviewed and updated Current Outpatient Medications  Medication Sig Dispense Refill  . buPROPion (WELLBUTRIN XL) 300 MG 24 hr tablet Take 1 tablet (300 mg total) by mouth  daily. 90 tablet 3  . Multiple Vitamins-Minerals (ONE-A-DAY MENS HEALTH FORMULA) TABS Take by mouth.    . valACYclovir (VALTREX) 1000 MG tablet Take 2 pills twice a day for 1 day at first sign of cold sore 30 tablet 1  . zolpidem (AMBIEN) 5 MG tablet Take 1 tablet (5 mg total) by mouth at bedtime as needed for sleep. 14 tablet 0   No current facility-administered medications for this visit.    Allergies-reviewed and updated No Known Allergies  Social History   Social History Narrative   Married. 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 Oropharynx normal  Neck: no thyromegaly CV: RRR no murmurs rubs or gallops Lungs: CTAB no crackles, wheeze, rhonchi Abdomen: soft/nontender/nondistended/normal bowel sounds. No rebound or guarding.  Ext: no edema Skin: warm, dry Neuro: grossly normal, moves all extremities, PERRLA    Assessment and Plan  59 y.o. male presenting for annual physical.  Health Maintenance counseling: 1. Anticipatory guidance: Patient counseled regarding regular dental exams q4 months, eye exams yearly- no issues with vision after Lasik- encouraged follow up visit at least every few years ,  avoiding smoking and second hand smoke, limiting alcohol to 2 beverages per day.   2. Risk factor reduction:  Advised patient of need for regular exercise and diet rich and fruits and vegetables to reduce risk of heart attack and stroke.  Exercise- swimming and doing some [employer] Diet- reasonably healthy.  Weight stable from last physical in 2018. 199 on home scales- has been his goal.  Wt Readings from Last 3 Encounters:  12/11/19 211 lb 9.6 oz (96 kg)  11/09/19 214 lb (97.1 kg)  01/16/19 213 lb 12.8 oz (97 kg)  3. Immunizations/screenings/ancillary studies-flu shot today  Immunization History  Administered Date(s) Administered  . Influenza Split 12/28/2011, 12/16/2018  . Influenza Whole 03/17/2003  . Influenza,inj,Quad PF,6+ Mos 02/15/2014, 12/04/2014, 02/18/2016, 02/01/2018  . Influenza-Unspecified 02/15/2014, 12/04/2014, 02/18/2016  . PFIZER SARS-COV-2 Vaccination 06/02/2019, 06/27/2019  . Td 03/17/1999, 06/29/2008  . Tdap 10/25/2018  . Zoster 06/25/2015  . Zoster Recombinat (Shingrix) 07/13/2016, 09/22/2016    4. Prostate cancer screening- low risk PSA trend-defers rectal Lab Results  Component Value Date   PSA 0.48 12/07/2019   PSA 0.43 10/25/2018   PSA 0.60 07/06/2016   5. Colon cancer screening - January 2018 with 3-year follow-up recommended-refer back to [place]/Dr. Carlean Purl at this time 6. Skin cancer screening-[place] [facility] with Dr. Ronnald Ramp yearly. advised regular sunscreen use. Denies worrisome, changing, or new skin lesions.  7. Never smoker 8. 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  Winter months tend to be hard Depression screen [facility] 2/9 12/11/2019 05/17/2018 02/15/2014  Decreased Interest 0 0 0  Down, Depressed, Hopeless 0 1 0  PHQ - 2 Score 0 1 0  Altered sleeping 0 1 1  Tired, decreased energy 0 1 1  Change in appetite 0 0 0  Feeling bad or failure about yourself  0 0 0  Trouble concentrating 0 0 0  Moving slowly or fidgety/restless 0 0 0  Suicidal thoughts 0 0 0  PHQ-9 Score 0 3 2  Difficult doing work/chores - Somewhat difficult -  A/P: Excellent control at present full remission-continue current medication  # cold sores/Herpes-on prophylaxis with twice daily Valtrex for cold sores in past  and then once a day-using just as needed now  #OSA-working with pulmonology to get set up for CPAP - didn't tolerate last time  #Insomnia ravel related- has tolerated ambien in the past with travel. Upcoming 2 week trip to [place]- will send in ambien 5 mg #14    Recommended follow up: Return in about 1 year (around 12/10/2020) for physical or sooner if needed. Future Appointments  Date Time Provider [place]  12/28/2019 10:30 AM Lauraine Rinne, NP [facility] None  01/23/2020  2:30 PM [facility] PREVISIT RM50 [facility]  02/05/2020  1:30 PM Nandigam, Venia Minks, MD [facility]    Lab/Order associations: Labs were done last week.   ICD-10-CM   1. Preventative health care  Z00.00   2. Hyperlipidemia, unspecified hyperlipidemia type  E78.5   3. Essential hypertension  I10   4. Major depressive disorder with single episode, in full remission ([place])  F32.5   5. Insomnia, unspecified type  G47.00   6. Encounter for screening colonoscopy  Z12.11 Ambulatory referral to Gastroenterology   Meds ordered this encounter  Medications  . zolpidem (AMBIEN) 5 MG tablet     Sig: Take 1 tablet (5 mg total) by mouth at bedtime as needed for sleep.    Dispense:  14 tablet    Refill:  0  . valACYclovir (VALTREX) 1000 MG tablet    Sig: Take 2 pills twice a day for 1 day at first sign of cold sore    Dispense:  30 tablet    Refill:  1  . buPROPion (WELLBUTRIN XL) 300 MG 24 hr tablet    Sig: Take 1 tablet (300 mg total) by mouth daily.    Dispense:  90 tablet    Refill:  3    Return precautions advised.  Garret Reddish, MD

## 2019-12-10 NOTE — Patient Instructions (Addendum)
Health Maintenance Due  Topic Date Due  . INFLUENZA VACCINE In office flu shot today 10/15/2019   We will call you within two weeks about your referral to GI. If you do not hear within 3 weeks, give [place] a call  Have a great trip!

## 2019-12-11 ENCOUNTER — Ambulatory Visit (INDEPENDENT_AMBULATORY_CARE_PROVIDER_SITE_OTHER): Payer: Commercial Managed Care - PPO | Admitting: Family Medicine

## 2019-12-11 ENCOUNTER — Encounter: Payer: Self-pay | Admitting: Family Medicine

## 2019-12-11 ENCOUNTER — Encounter: Payer: Self-pay | Admitting: Gastroenterology

## 2019-12-11 ENCOUNTER — Other Ambulatory Visit: Payer: Self-pay

## 2019-12-11 VITALS — BP 122/64 | HR 76 | Temp 98.2°F | Resp 18 | Ht 70.0 in | Wt 211.6 lb

## 2019-12-11 DIAGNOSIS — Z23 Encounter for immunization: Secondary | ICD-10-CM | POA: Diagnosis not present

## 2019-12-11 DIAGNOSIS — F325 Major depressive disorder, single episode, in full remission: Secondary | ICD-10-CM | POA: Diagnosis not present

## 2019-12-11 DIAGNOSIS — E785 Hyperlipidemia, unspecified: Secondary | ICD-10-CM | POA: Diagnosis not present

## 2019-12-11 DIAGNOSIS — I10 Essential (primary) hypertension: Secondary | ICD-10-CM

## 2019-12-11 DIAGNOSIS — G4700 Insomnia, unspecified: Secondary | ICD-10-CM

## 2019-12-11 DIAGNOSIS — Z0000 Encounter for general adult medical examination without abnormal findings: Secondary | ICD-10-CM

## 2019-12-11 DIAGNOSIS — Z1211 Encounter for screening for malignant neoplasm of colon: Secondary | ICD-10-CM

## 2019-12-11 MED ORDER — ZOLPIDEM TARTRATE 5 MG PO TABS: 5.0000 mg | ORAL_TABLET | Freq: Every evening | ORAL | 0 refills | Status: DC | PRN

## 2019-12-11 MED ORDER — BUPROPION HCL ER (XL) 300 MG PO TB24
300.0000 mg | ORAL_TABLET | Freq: Every day | ORAL | 3 refills | Status: DC
Start: 2019-12-11 — End: 2021-01-13

## 2019-12-11 MED ORDER — VALACYCLOVIR HCL 1 G PO TABS: ORAL_TABLET | ORAL | 1 refills | Status: DC

## 2019-12-11 NOTE — Addendum Note (Signed)
Addended by: Thomes Cake on: 12/11/2019 03:41 PM   Modules accepted: Orders

## 2019-12-28 ENCOUNTER — Ambulatory Visit: Payer: Commercial Managed Care - PPO | Admitting: Pulmonary Disease

## 2020-01-10 ENCOUNTER — Encounter: Payer: Self-pay | Admitting: Pulmonary Disease

## 2020-01-10 ENCOUNTER — Other Ambulatory Visit: Payer: Self-pay

## 2020-01-10 ENCOUNTER — Ambulatory Visit (INDEPENDENT_AMBULATORY_CARE_PROVIDER_SITE_OTHER): Payer: Commercial Managed Care - PPO | Admitting: Pulmonary Disease

## 2020-01-10 VITALS — BP 124/82 | HR 58 | Temp 97.6°F | Ht 75.0 in | Wt 208.4 lb

## 2020-01-10 DIAGNOSIS — Z0000 Encounter for general adult medical examination without abnormal findings: Secondary | ICD-10-CM | POA: Insufficient documentation

## 2020-01-10 DIAGNOSIS — G4733 Obstructive sleep apnea (adult) (pediatric): Secondary | ICD-10-CM | POA: Diagnosis not present

## 2020-01-10 NOTE — Assessment & Plan Note (Signed)
Feeling clinical improvement since starting CPAP therapy CPAP compliance shows adequate compliance with moderately controlled AHI of 7.8 No longer taking Ambien this was only for recent overseas travel  Discussion: Offered CPAP titration study to obtain a better pressure setting to hopefully control AHI and keep under 5.  Patient declined today.  He would prefer to clinically monitor and continue to work on CPAP adherence and reevaluate in 3 months  Plan: We will send message to [employer] and contact [employer] Representative Melissa to notify the patient would like to purchase a second CPAP as he has a vacation home We will put in a reminder to check CPAP compliance in 3 months We will have patient continue to use CPAP therapy We will have [employer] contact the patient regarding his mask use as well as ResMed app access Follow-up in 1 year

## 2020-01-10 NOTE — Patient Instructions (Addendum)
You were seen today by Lauraine Rinne, NP  for:   1. OSA (obstructive sleep apnea)  I have contacted [employer] and we will also send a message over to have them contact you about purchasing a second CPAP that you can keep your vacation home  We will also have them work with you on your ResMed app and ensure that mask are fitting appropriately given the fact that you are needing to replace them so often  We will check your CPAP compliance in 3 months, if I notice any abnormalities then we will follow-up with you  We recommend that you continue using your CPAP daily >>>Keep up the hard work using your device >>> Goal should be wearing this for the entire night that you are sleeping, at least 4 to 6 hours  Remember:  . Do not drive or operate heavy machinery if tired or drowsy.  . Please notify the supply company and office if you are unable to use your device regularly due to missing supplies or machine being broken.  . Work on maintaining a healthy weight and following your recommended nutrition plan  . Maintain proper daily exercise and movement  . Maintaining proper use of your device can also help improve management of other chronic illnesses such as: Blood pressure, blood sugars, and weight management.   BiPAP/ CPAP Cleaning:  >>>Clean weekly, with Dawn soap, and bottle brush.  Set up to air dry. >>> Wipe mask out daily with wet wipe or towelette    2.  Healthcare maintenance  As discussed today would recommend that you obtain your COVID-19 booster vaccine  Would recommend either accessing [place] website looking for vaccination sites or contacting your local commercial pharmacy   Follow Up:    Return in about 1 year (around 01/09/2021), or if symptoms worsen or fail to improve, for Follow up with Dr. Elsworth Soho, Follow up with Wyn Quaker FNP-C.   Notification of test results are managed in the following manner: If there are  any recommendations or changes to the  plan of care  discussed in office today,  we will contact you and let you know what they are. If you do not hear from [place], then your results are normal and you can view them through your  MyChart account , or a letter will be sent to you. Thank you again for trusting [place] with your care  - Thank you, [facility]    It is flu season:   >>> Best ways to protect herself from the flu: Receive the yearly flu vaccine, practice good hand hygiene washing with soap and also using hand sanitizer when available, eat a nutritious meals, get adequate rest, hydrate appropriately       Please contact the office if your symptoms worsen or you have concerns that you are not improving.   Thank you for choosing [facility] for your healthcare, and for allowing [place] to partner with you on your healthcare journey. I am thankful to be able to provide care to you today.   Wyn Quaker FNP-C     Sleep Apnea Sleep apnea affects breathing during sleep. It causes breathing to stop for a short time or to become shallow. It can also increase the risk of:  Heart attack.  Stroke.  Being very overweight (obese).  Diabetes.  Heart failure.  Irregular heartbeat. The goal of treatment is to help you breathe normally again. What are the causes? There are three kinds of  sleep apnea:  Obstructive sleep apnea. This is caused by a blocked or collapsed airway.  Central sleep apnea. This happens when the brain does not send the right signals to the muscles that control breathing.  Mixed sleep apnea. This is a combination of obstructive and central sleep apnea. The most common cause of this condition is a collapsed or blocked airway. This can happen if:  Your throat muscles are too relaxed.  Your tongue and tonsils are too large.  You are overweight.  Your airway is too small. What increases the risk?  Being overweight.  Smoking.  Having a small airway.  Being older.  Being male.  Drinking  alcohol.  Taking medicines to calm yourself (sedatives or tranquilizers).  Having family members with the condition. What are the signs or symptoms?  Trouble staying asleep.  Being sleepy or tired during the day.  Getting angry a lot.  Loud snoring.  Headaches in the morning.  Not being able to focus your mind (concentrate).  Forgetting things.  Less interest in sex.  Mood swings.  Personality changes.  Feelings of sadness (depression).  Waking up a lot during the night to pee (urinate).  Dry mouth.  Sore throat. How is this diagnosed?  Your medical history.  A physical exam.  A test that is done when you are sleeping (sleep study). The test is most often done in a sleep lab but may also be done at home. How is this treated?   Sleeping on your side.  Using a medicine to get rid of mucus in your nose (decongestant).  Avoiding the use of alcohol, medicines to help you relax, or certain pain medicines (narcotics).  Losing weight, if needed.  Changing your diet.  Not smoking.  Using a machine to open your airway while you sleep, such as: ? An oral appliance. This is a mouthpiece that shifts your lower jaw forward. ? A CPAP device. This device blows air through a mask when you breathe out (exhale). ? An EPAP device. This has valves that you put in each nostril. ? A BPAP device. This device blows air through a mask when you breathe in (inhale) and breathe out.  Having surgery if other treatments do not work. It is important to get treatment for sleep apnea. Without treatment, it can lead to:  High blood pressure.  Coronary artery disease.  In men, not being able to have an erection (impotence).  Reduced thinking ability. Follow these instructions at home: Lifestyle  Make changes that your doctor recommends.  Eat a healthy diet.  Lose weight if needed.  Avoid alcohol, medicines to help you relax, and some pain medicines.  Do not use any  products that contain nicotine or tobacco, such as cigarettes, e-cigarettes, and chewing tobacco. If you need help quitting, ask your doctor. General instructions  Take over-the-counter and prescription medicines only as told by your doctor.  If you were given a machine to use while you sleep, use it only as told by your doctor.  If you are having surgery, make sure to tell your doctor you have sleep apnea. You may need to bring your device with you.  Keep all follow-up visits as told by your doctor. This is important. Contact a doctor if:  The machine that you were given to use during sleep bothers you or does not seem to be working.  You do not get better.  You get worse. Get help right away if:  Your chest hurts.  You have trouble breathing in enough air.  You have an uncomfortable feeling in your back, arms, or stomach.  You have trouble talking.  One side of your body feels weak.  A part of your face is hanging down. These symptoms may be an emergency. Do not wait to see if the symptoms will go away. Get medical help right away. Call your local emergency services (911 in the [place] Do not drive yourself to the hospital. Summary  This condition affects breathing during sleep.  The most common cause is a collapsed or blocked airway.  The goal of treatment is to help you breathe normally while you sleep. This information is not intended to replace advice given to you by your health care provider. Make sure you discuss any questions you have with your health care provider. Document Revised: 12/17/2017 Document Reviewed: 10/26/2017 Elsevier Patient Education  [place].

## 2020-01-10 NOTE — Progress Notes (Signed)
@Patient  ID: Curtis Hendrix, male    DOB: Dec 07, 1960, 59 y.o.   MRN: [id number]  Chief Complaint  Patient presents with  . Follow-up    wants a CPAP for travel     Referring provider: Marin Olp, MD  HPI:  60 year old male never smoker followed in our office for obstructive sleep apnea  PMH: Hyperlipidemia, hypertension, depression, chronic prostatitis Smoker/ Smoking History: Never smoker Maintenance: None Pt of: Dr. Elsworth Soho  01/10/2020  - Visit   59 year old male never smoker followed in our office for obstructive sleep apnea.  He was last seen by Dr. Elsworth Soho in August/2021.  Was recommended for him to start CPAP therapy.  Patient presenting today.  CPAP compliance report shows adequate compliance with room for improvement.  See compliance report listed below:  12/11/2019-01/09/2020-22 at the last 30 days used, 16 of those days greater than 4 hours, average usage 5 hours and 34 minutes, APAP setting 5-15, 95th percentile 12.3, AHI 7.8, central apneas 2.4, obstructive 3.3, unknown 1.1 >>>Occasional leaks  Patient reports that overall is been doing well since starting CPAP therapy.  He feels that this has been helpful.  He is hoping to purchase a second CPAP that [place] can [place].  His DME company is [employer].  He is already feeling clinical improvement with less daytime fatigue.  He did have Ambien on his med list with this was simply for plane travel to [place].  He does not take this regularly and is no longer taking it right now.  He has a question about if we can help him gain access to the [employer].  He is having difficulty with this.  We will discuss this today.  He also is wondering if he should be going through mask as quickly as he is.  He is replacing his CPAP mask about every 2.5 weeks  Questionaires / Pulmonary Flowsheets:   ACT:  No flowsheet data found.  MMRC: No flowsheet data found.  [facility]:  No flowsheet data found.  Tests:   04/2014-wt 210  pounds- AHI 28/hour, predominant hypopneas, lowest desaturation to 85%, severe PLM some of which were associated with arousals   FENO:  No results found for: NITRICOXIDE  PFT: No flowsheet data found.  WALK:  No flowsheet data found.  Imaging: No results found.  Lab Results:  CBC    Component Value Date/Time   WBC 4.6 12/07/2019 0959   RBC 4.32 12/07/2019 0959   HGB 14.9 12/07/2019 0959   HCT 43.2 12/07/2019 0959   PLT 245 12/07/2019 0959   MCV 100.0 12/07/2019 0959   MCH 34.5 (H) 12/07/2019 0959   MCHC 34.5 12/07/2019 0959   RDW 12.2 12/07/2019 0959   LYMPHSABS 1,297 12/07/2019 0959   MONOABS 0.4 06/12/2015 0907   EOSABS 51 12/07/2019 0959   BASOSABS 60 12/07/2019 0959    BMET    Component Value Date/Time   NA 134 (L) 12/07/2019 0959   K 4.1 12/07/2019 0959   CL 99 12/07/2019 0959   CO2 27 12/07/2019 0959   GLUCOSE 77 12/07/2019 0959   BUN 11 12/07/2019 0959   CREATININE 1.00 12/07/2019 0959   CALCIUM 9.6 12/07/2019 0959   GFRNONAA 85.06 06/25/2009 0923   GFRAA 117 06/06/2007 0904    BNP No results found for: BNP  ProBNP No results found for: PROBNP  Specialty Problems      Pulmonary Problems   OSA (obstructive sleep apnea)    04/2014- AHI  28/h. Did not tolerate- sleeping on his side.          No Known Allergies  Immunization History  Administered Date(s) Administered  . Influenza Split 12/28/2011, 12/16/2018  . Influenza Whole 03/17/2003  . Influenza,inj,Quad PF,6+ Mos 02/15/2014, 12/04/2014, 02/18/2016, 02/01/2018, 12/11/2019  . Influenza-Unspecified 02/15/2014, 12/04/2014, 02/18/2016  . PFIZER SARS-COV-2 Vaccination 06/02/2019, 06/27/2019  . Td 03/17/1999, 06/29/2008  . Tdap 10/25/2018  . Zoster 06/25/2015  . Zoster Recombinat (Shingrix) 07/13/2016, 09/22/2016    Past Medical History:  Diagnosis Date  . Arthritis   . Fracture of rib of left side 04/2013  . Hearing difficulty    left ear-since college. high frequency loss.   .  Hypertension   . Personal history of colonic adenomas 02/23/2013   02/23/2013 6 small polyps removed, 5 recovered - all adenomas repeat colonoscopy 02/2016    . Radiculopathy, lumbar region     Tobacco History: Social History   Tobacco Use  Smoking Status Never Smoker  Smokeless Tobacco Never Used   Counseling given: Yes   Continue to not smoke  Outpatient Encounter Medications as of 01/10/2020  Medication Sig  . buPROPion (WELLBUTRIN XL) 300 MG 24 hr tablet Take 1 tablet (300 mg total) by mouth daily.  . Multiple Vitamins-Minerals (ONE-A-DAY MENS HEALTH FORMULA) TABS Take by mouth.  . valACYclovir (VALTREX) 1000 MG tablet Take 2 pills twice a day for 1 day at first sign of cold sore  . [DISCONTINUED] zolpidem (AMBIEN) 5 MG tablet Take 1 tablet (5 mg total) by mouth at bedtime as needed for sleep. (Patient not taking: Reported on 01/10/2020)   No facility-administered encounter medications on file as of 01/10/2020.     Review of Systems  Review of Systems  Constitutional: Negative for activity change, chills, fatigue, fever and unexpected weight change.  HENT: Negative for postnasal drip, rhinorrhea, sinus pressure, sinus pain and sore throat.   Eyes: Negative.   Respiratory: Negative for cough, shortness of breath and wheezing.   Cardiovascular: Negative for chest pain and palpitations.  Gastrointestinal: Negative for constipation, diarrhea, nausea and vomiting.  Endocrine: Negative.   Genitourinary: Negative.   Musculoskeletal: Negative.   Skin: Negative.   Neurological: Negative for dizziness and headaches.  Psychiatric/Behavioral: Negative.  Negative for dysphoric mood. The patient is not nervous/anxious.   All other systems reviewed and are negative.    Physical Exam  BP 124/82 (BP Location: Left Arm, Cuff Size: Normal)   Pulse (!) 58   Temp 97.6 F (36.4 C) (Oral)   Ht 6\' 3"  (1.905 m)   Wt 208 lb 6.4 oz (94.5 kg)   SpO2 98%   BMI 26.05 kg/m   Wt  Readings from Last 5 Encounters:  01/10/20 208 lb 6.4 oz (94.5 kg)  12/11/19 211 lb 9.6 oz (96 kg)  11/09/19 214 lb (97.1 kg)  01/16/19 213 lb 12.8 oz (97 kg)  10/25/18 210 lb 6.4 oz (95.4 kg)    BMI Readings from Last 5 Encounters:  01/10/20 26.05 kg/m  12/11/19 30.36 kg/m  11/09/19 30.71 kg/m  01/16/19 27.45 kg/m  10/25/18 26.30 kg/m     Physical Exam Vitals and nursing note reviewed.  Constitutional:      General: He is not in acute distress.    Appearance: Normal appearance. He is normal weight.  HENT:     Head: Normocephalic and atraumatic.     Right Ear: Hearing and external ear normal.     Left Ear: Hearing and external ear normal.  Nose: No mucosal edema.     Right Turbinates: Not enlarged.     Left Turbinates: Not enlarged.     Comments: Deferred due to masking requirement    Mouth/Throat:     Comments: Deferred due to masking requirement Eyes:     Pupils: Pupils are equal, round, and reactive to light.  Cardiovascular:     Rate and Rhythm: Normal rate and regular rhythm.     Pulses: Normal pulses.     Heart sounds: Normal heart sounds. No murmur heard.   Pulmonary:     Effort: Pulmonary effort is normal.     Breath sounds: Normal breath sounds. No decreased breath sounds, wheezing or rales.  Musculoskeletal:     Cervical back: Normal range of motion.     Right lower leg: No edema.     Left lower leg: No edema.  Lymphadenopathy:     Cervical: No cervical adenopathy.  Skin:    General: Skin is warm and dry.     Capillary Refill: Capillary refill takes less than 2 seconds.     Findings: No erythema or rash.  Neurological:     General: No focal deficit present.     Mental Status: He is alert and oriented to person, place, and time.     Motor: No weakness.     Coordination: Coordination normal.     Gait: Gait is intact. Gait normal.  Psychiatric:        Mood and Affect: Mood normal.        Behavior: Behavior normal. Behavior is cooperative.          Thought Content: Thought content normal.        Judgment: Judgment normal.       Assessment & Plan:   OSA (obstructive sleep apnea) Feeling clinical improvement since starting CPAP therapy CPAP compliance shows adequate compliance with moderately controlled AHI of 7.8 No longer taking Ambien this was only for recent overseas travel  Discussion: Offered CPAP titration study to obtain a better pressure setting to hopefully control AHI and keep under 5.  Patient declined today.  He would prefer to clinically monitor and continue to work on CPAP adherence and reevaluate in 3 months  Plan: We will send message to [employer] and contact [employer] Representative Melissa to notify the patient would like to purchase a second CPAP as he has a vacation home We will put in a reminder to check CPAP compliance in 3 months We will have patient continue to use CPAP therapy We will have [employer] contact the patient regarding his mask use as well as [facility] app access Follow-up in 1 year  Healthcare maintenance Up-to-date with flu vaccine Up-to-date with COVID-19 vaccinations  Plan: Patient plans to obtain COVID-19 booster    Return in about 1 year (around 01/09/2021), or if symptoms worsen or fail to improve, for Follow up with Dr. Elsworth Soho, Follow up with Wyn Quaker FNP-C.   Lauraine Rinne, NP 01/10/2020   This appointment required 31 minutes of patient care (this includes precharting, chart review, review of results, face-to-face care, etc.).

## 2020-01-10 NOTE — Assessment & Plan Note (Signed)
Up-to-date with flu vaccine Up-to-date with COVID-19 vaccinations  Plan: Patient plans to obtain COVID-19 booster

## 2020-01-12 ENCOUNTER — Other Ambulatory Visit: Payer: Self-pay | Admitting: Family Medicine

## 2020-01-23 ENCOUNTER — Other Ambulatory Visit: Payer: Self-pay

## 2020-01-23 ENCOUNTER — Ambulatory Visit (AMBULATORY_SURGERY_CENTER): Payer: Self-pay | Admitting: *Deleted

## 2020-01-23 VITALS — Ht 70.0 in | Wt 211.0 lb

## 2020-01-23 DIAGNOSIS — Z86010 Personal history of colonic polyps: Secondary | ICD-10-CM

## 2020-01-23 NOTE — Progress Notes (Signed)
Patient is here in-person for PV. Patient denies any allergies to eggs or soy. Patient denies any problems with anesthesia/sedation. Patient denies any oxygen use at home. Patient denies taking any diet/weight loss medications or blood thinners. Patient is not being treated for MRSA or C-diff. Patient is aware of our care-partner policy and Covid-19 safety protocol.   COVID-19 vaccines completed on 12/2019 booster, per patient.    

## 2020-01-30 ENCOUNTER — Encounter: Payer: Self-pay | Admitting: Internal Medicine

## 2020-02-05 ENCOUNTER — Encounter: Payer: Commercial Managed Care - PPO | Admitting: Gastroenterology

## 2020-02-13 ENCOUNTER — Other Ambulatory Visit: Payer: Self-pay

## 2020-02-13 ENCOUNTER — Encounter: Payer: Self-pay | Admitting: Internal Medicine

## 2020-02-13 ENCOUNTER — Ambulatory Visit (AMBULATORY_SURGERY_CENTER): Payer: Commercial Managed Care - PPO | Admitting: Internal Medicine

## 2020-02-13 VITALS — BP 134/97 | HR 57 | Temp 96.9°F | Resp 13 | Ht 70.0 in | Wt 211.0 lb

## 2020-02-13 DIAGNOSIS — D122 Benign neoplasm of ascending colon: Secondary | ICD-10-CM

## 2020-02-13 DIAGNOSIS — D127 Benign neoplasm of rectosigmoid junction: Secondary | ICD-10-CM

## 2020-02-13 DIAGNOSIS — D125 Benign neoplasm of sigmoid colon: Secondary | ICD-10-CM | POA: Diagnosis not present

## 2020-02-13 DIAGNOSIS — D123 Benign neoplasm of transverse colon: Secondary | ICD-10-CM | POA: Diagnosis not present

## 2020-02-13 DIAGNOSIS — D124 Benign neoplasm of descending colon: Secondary | ICD-10-CM | POA: Diagnosis not present

## 2020-02-13 DIAGNOSIS — D128 Benign neoplasm of rectum: Secondary | ICD-10-CM

## 2020-02-13 DIAGNOSIS — Z86010 Personal history of colonic polyps: Secondary | ICD-10-CM

## 2020-02-13 MED ORDER — SODIUM CHLORIDE 0.9 % IV SOLN: 500.0000 mL | Freq: Once | INTRAVENOUS | Status: DC

## 2020-02-13 NOTE — Progress Notes (Signed)
A/ox3, pleased with MAC, report to RN 

## 2020-02-13 NOTE — Op Note (Signed)
[name] Procedure Date: 02/13/2020 12:10 PM MRN: [id number] Endoscopist: Gatha Mayer , MD Age: 59 Referring MD:  Date of Birth: January 31, 1961 Gender: Male Account #: [id number] Procedure:                Colonoscopy Indications:              Surveillance: Personal history of adenomatous                            polyps on last colonoscopy 3 years ago Medicines:                Propofol per Anesthesia, Monitored Anesthesia Care Procedure:                Pre-Anesthesia Assessment:                           - Prior to the procedure, a History and Physical                            was performed, and patient medications and                            allergies were reviewed. The patient's tolerance of                            previous anesthesia was also reviewed. The risks                            and benefits of the procedure and the sedation                            options and risks were discussed with the patient.                            All questions were answered, and informed consent                            was obtained. Prior Anticoagulants: The patient has                            taken no previous anticoagulant or antiplatelet                            agents. ASA Grade Assessment: II - A patient with                            mild systemic disease. After reviewing the risks                            and benefits, the patient was deemed in                            satisfactory condition to undergo the procedure.  After obtaining informed consent, the colonoscope                            was passed under direct vision. Throughout the                            procedure, the patient's blood pressure, pulse, and                            oxygen saturations were monitored continuously. The                            Colonoscope was introduced through the anus and                             advanced to the the cecum, identified by                            appendiceal orifice and ileocecal valve. The                            colonoscopy was performed without difficulty. The                            patient tolerated the procedure well. The quality                            of the bowel preparation was good. The bowel                            preparation used was Miralax via split dose                            instruction. The ileocecal valve, appendiceal                            orifice, and rectum were photographed. Scope In: 12:23:01 PM Scope Out: 12:47:05 PM Scope Withdrawal Time: 0 hours 15 minutes 38 seconds  Total Procedure Duration: 0 hours 24 minutes 4 seconds  Findings:                 The perianal and digital rectal examinations were                            normal. Pertinent negatives include normal prostate                            (size, shape, and consistency).                           Three sessile polyps were found in the rectum,                            sigmoid colon and descending colon. The polyps were  3 to 6 mm in size. These polyps were removed with a                            cold snare. Resection and retrieval were complete.                            Verification of patient identification for the                            specimen was done. Estimated blood loss was minimal.                           Two sessile polyps were found in the transverse                            colon and ascending colon. The polyps were 1 to 2                            mm in size. These polyps were removed with a cold                            biopsy forceps. Resection and retrieval were                            complete. Verification of patient identification                            for the specimen was done. Estimated blood loss was                            minimal.                           Multiple small and  large-mouthed diverticula were                            found in the sigmoid colon and descending colon.                           The exam was otherwise without abnormality on                            direct and retroflexion views. Complications:            No immediate complications. Estimated Blood Loss:     Estimated blood loss was minimal. Impression:               - Three 3 to 6 mm polyps in the rectum, in the                            sigmoid colon and in the descending colon, removed                            with  a cold snare. Resected and retrieved.                           - Two 1 to 2 mm polyps in the transverse colon and                            in the ascending colon, removed with a cold biopsy                            forceps. Resected and retrieved.                           - Diverticulosis in the sigmoid colon and in the                            descending colon.                           - The examination was otherwise normal on direct                            and retroflexion views.                           - Personal history of colonic polyps. 5 adenomas                            all sub cm in 2014 and 2018 Recommendation:           - Patient has a contact number available for                            emergencies. The signs and symptoms of potential                            delayed complications were discussed with the                            patient. Return to normal activities tomorrow.                            Written discharge instructions were provided to the                            patient.                           - Resume previous diet.                           - Continue present medications.                           - Repeat colonoscopy is recommended for  surveillance. The colonoscopy date will be                            determined after pathology results from today's                             exam become available for review. Gatha Mayer, MD 02/13/2020 12:55:16 PM This report has been signed electronically.

## 2020-02-13 NOTE — Patient Instructions (Addendum)
I removed 5 tiny polyps today and saw diverticulosis again.  Polyps all look benign.  I will let you know pathology results and when to have another routine colonoscopy by mail and/or My Chart.  I appreciate the opportunity to care for you. Gatha Mayer, MD, [facility]   Handouts given for diverticulosis and polyps.   YOU HAD AN ENDOSCOPIC PROCEDURE TODAY AT [facility]:   Refer to the procedure report that was given to you for any specific questions about what was found during the examination.  If the procedure report does not answer your questions, please call your gastroenterologist to clarify.  If you requested that your care partner not be given the details of your procedure findings, then the procedure report has been included in a sealed envelope for you to review at your convenience later.  YOU SHOULD EXPECT: Some feelings of bloating in the abdomen. Passage of more gas than usual.  Walking can help get rid of the air that was put into your GI tract during the procedure and reduce the bloating. If you had a lower endoscopy (such as a colonoscopy or flexible sigmoidoscopy) you may notice spotting of blood in your stool or on the toilet paper. If you underwent a bowel prep for your procedure, you may not have a normal bowel movement for a few days.  Please Note:  You might notice some irritation and congestion in your nose or some drainage.  This is from the oxygen used during your procedure.  There is no need for concern and it should clear up in a day or so.  SYMPTOMS TO REPORT IMMEDIATELY:   Following lower endoscopy (colonoscopy or flexible sigmoidoscopy):  Excessive amounts of blood in the stool  Significant tenderness or worsening of abdominal pains  Swelling of the abdomen that is new, acute  Fever of 100F or higher  For urgent or emergent issues, a gastroenterologist can be reached at any hour by calling [phone]. Do not use MyChart messaging for urgent  concerns.    DIET:  We do recommend a small meal at first, but then you may proceed to your regular diet.  Drink plenty of fluids but you should avoid alcoholic beverages for 24 hours.  ACTIVITY:  You should plan to take it easy for the rest of today and you should NOT DRIVE or use heavy machinery until tomorrow (because of the sedation medicines used during the test).    FOLLOW UP: Our staff will call the number listed on your records 48-72 hours following your procedure to check on you and address any questions or concerns that you may have regarding the information given to you following your procedure. If we do not reach you, we will leave a message.  We will attempt to reach you two times.  During this call, we will ask if you have developed any symptoms of COVID 19. If you develop any symptoms (ie: fever, flu-like symptoms, shortness of breath, cough etc.) before then, please call [phone].  If you test positive for Covid 19 in the 2 weeks post procedure, please call and report this information to [place].    If any biopsies were taken you will be contacted by phone or by letter within the next 1-3 weeks.  Please call us at [phone] if you have not heard about the biopsies in 3 weeks.    SIGNATURES/CONFIDENTIALITY: You and/or your care partner have signed paperwork which will be entered into your electronic medical  record.  These signatures attest to the fact that that the information above on your After Visit Summary has been reviewed and is understood.  Full responsibility of the confidentiality of this discharge information lies with you and/or your care-partner. 

## 2020-02-13 NOTE — Progress Notes (Signed)
Called to room to assist during endoscopic procedure.  Patient ID and intended procedure confirmed with present staff. Received instructions for my participation in the procedure from the performing physician.  

## 2020-02-13 NOTE — Progress Notes (Signed)
previsit done by RM.  Pt states no changes to health hx since previsit.  VS by [place].

## 2020-02-15 ENCOUNTER — Telehealth: Payer: Self-pay | Admitting: *Deleted

## 2020-02-15 ENCOUNTER — Telehealth: Payer: Self-pay

## 2020-02-15 NOTE — Telephone Encounter (Signed)
Left message on follow up call. 

## 2020-02-15 NOTE — Telephone Encounter (Signed)
No answer for post procedure call back. Left message for patient to call with questions or concerns. 

## 2020-02-25 ENCOUNTER — Encounter: Payer: Self-pay | Admitting: Internal Medicine

## 2020-08-21 ENCOUNTER — Telehealth: Payer: Self-pay | Admitting: Pulmonary Disease

## 2020-08-21 DIAGNOSIS — G4733 Obstructive sleep apnea (adult) (pediatric): Secondary | ICD-10-CM

## 2020-08-21 NOTE — Telephone Encounter (Signed)
Attempted to call pt but unable to reach. Left message for him to return call. °

## 2020-08-22 NOTE — Telephone Encounter (Signed)
Pt calling because he is needing to get a rx so he can get his cpap- he's buying it outright from the company but needs the rx for them to ship it-he's going out of the country Tuesday and needs this asap. Please advise. [phone] [employer](mini air cpap) Phone-[phone] Krystal Eaton [email or web address]

## 2020-08-22 NOTE — Telephone Encounter (Signed)
I called and spoke with patient who paid out of pocket for another mini CPAP machine and is needing a script sent to that office APAP as patient is going out of town on 08/27/2020. I called the [facility] and got a fax number. I printed out a script and had Dr. Elsworth Soho sign it and sent to both fax numbers given. Patient verbalized understanding, nothing further needed.

## 2020-12-11 ENCOUNTER — Ambulatory Visit (INDEPENDENT_AMBULATORY_CARE_PROVIDER_SITE_OTHER): Payer: Commercial Managed Care - PPO | Admitting: Family Medicine

## 2020-12-11 DIAGNOSIS — E785 Hyperlipidemia, unspecified: Secondary | ICD-10-CM

## 2020-12-11 DIAGNOSIS — I10 Essential (primary) hypertension: Secondary | ICD-10-CM

## 2020-12-11 DIAGNOSIS — F325 Major depressive disorder, single episode, in full remission: Secondary | ICD-10-CM

## 2020-12-11 DIAGNOSIS — G4733 Obstructive sleep apnea (adult) (pediatric): Secondary | ICD-10-CM

## 2020-12-11 NOTE — Patient Instructions (Signed)
Health Maintenance Due  Topic Date Due   COVID-19 Vaccine (4 - Booster for Pfizer series) 05/12/2020   INFLUENZA VACCINE  10/14/2020    Recommended follow up: No follow-ups on file.

## 2020-12-12 NOTE — Progress Notes (Signed)
Patient no-show to visit for physical

## 2020-12-16 ENCOUNTER — Encounter: Payer: Self-pay | Admitting: Family Medicine

## 2021-01-13 ENCOUNTER — Other Ambulatory Visit: Payer: Self-pay | Admitting: Family Medicine

## 2021-02-01 ENCOUNTER — Other Ambulatory Visit: Payer: Self-pay | Admitting: Family Medicine

## 2021-04-23 ENCOUNTER — Telehealth: Payer: Self-pay | Admitting: Family Medicine

## 2021-04-23 MED ORDER — ZOLPIDEM TARTRATE 5 MG PO TABS: 5.0000 mg | ORAL_TABLET | Freq: Every evening | ORAL | 0 refills | Status: DC | PRN

## 2021-04-23 NOTE — Telephone Encounter (Signed)
Gave patient message below. Patient is scheduled for a virtual.

## 2021-04-23 NOTE — Telephone Encounter (Signed)
Left message for patient to return call to office. 

## 2021-04-23 NOTE — Telephone Encounter (Signed)
Please advise 

## 2021-04-23 NOTE — Telephone Encounter (Signed)
Pt states he is going out of the country to [place], [place], and [place] starting 2/15. He stated he needs 21 days of sleeping medication he previously was prescribed for traveling. He also would like antibiotics or medication for stomach issues he may encounter. Please advise.

## 2021-04-24 ENCOUNTER — Other Ambulatory Visit: Payer: Self-pay

## 2021-04-24 ENCOUNTER — Encounter: Payer: Self-pay | Admitting: Family Medicine

## 2021-04-24 ENCOUNTER — Telehealth (INDEPENDENT_AMBULATORY_CARE_PROVIDER_SITE_OTHER): Payer: Commercial Managed Care - PPO | Admitting: Family Medicine

## 2021-04-24 VITALS — Ht 70.0 in | Wt 208.0 lb

## 2021-04-24 DIAGNOSIS — I10 Essential (primary) hypertension: Secondary | ICD-10-CM | POA: Diagnosis not present

## 2021-04-24 DIAGNOSIS — E785 Hyperlipidemia, unspecified: Secondary | ICD-10-CM

## 2021-04-24 DIAGNOSIS — N401 Enlarged prostate with lower urinary tract symptoms: Secondary | ICD-10-CM

## 2021-04-24 DIAGNOSIS — R351 Nocturia: Secondary | ICD-10-CM | POA: Diagnosis not present

## 2021-04-24 MED ORDER — AZITHROMYCIN 250 MG PO TABS: ORAL_TABLET | ORAL | 0 refills | Status: DC

## 2021-04-24 NOTE — Progress Notes (Signed)
Phone [phone] Virtual visit via phonenote   Subjective:   Chief Complaint  Patient presents with   Discuss Travel Medicine    Pt is wanting to discuss getting travel medication and he is travleing to [place], he would like sleep medicine and meds incase he gets a bug while he is there.   This visit type was conducted due to national recommendations for restrictions regarding the COVID-19 Pandemic (e.g. social distancing).  This format is felt to be most appropriate for this patient at this time balancing risks to patient and risks to population by having him in for in person visit.  All issues noted in this document were discussed and addressed.  No physical exam was performed (except for noted visual exam or audio findings with Telehealth visits).  The patient has consented to conduct a Telehealth visit and understands insurance will be billed.   Our team/I connected with Curtis Hendrix at 11:40 AM EST by phone (patient did not have equipment for webex) and verified that I am speaking with the correct person using two identifiers.  Location patient: [facility] Location provider: [facility], office Persons participating in the virtual visit:  patient  Time on phone: 11 minutes  Our team/I discussed the limitations of evaluation and management by telemedicine and the availability of in person appointments. In light of current covid-19 pandemic, patient also understands that we are trying to protect them by minimizing in office contact if at all possible.  The patient expressed consent for telemedicine visit and agreed to proceed. Patient understands insurance will be billed.   Past Medical History-  Patient Active Problem List   Diagnosis Date Noted   BPH associated with nocturia 06/25/2015    Priority: Medium    OSA (obstructive sleep apnea) 02/15/2014    Priority: Medium    Depression 11/07/2013    Priority: Medium    Oral herpes simplex infection 11/07/2013    Priority:  Medium    Hyperlipidemia 09/09/2007    Priority: Medium    Essential hypertension 06/10/2007    Priority: Medium    Insomnia 12/18/2013    Priority: Low   History of colonic polyps 02/23/2013    Priority: Low   Chronic prostatitis 09/09/2007    Priority: Low   CHEST PAIN UNSPECIFIED 07/19/2007    Priority: [place] maintenance 01/10/2020    Medications- reviewed and updated Current Outpatient Medications  Medication Sig Dispense Refill   azithromycin (ZITHROMAX) 250 MG tablet Take 4 tablets all at once for travelers diarrhea if you have fever OR blood, pus or mucus in the stool. 4 tablet 0   buPROPion (WELLBUTRIN XL) 300 MG 24 hr tablet TAKE 1 TABLET BY MOUTH EVERY DAY 90 tablet 3   Multiple Vitamins-Minerals (ONE-A-DAY MENS HEALTH FORMULA) TABS Take by mouth.     valACYclovir (VALTREX) 1000 MG tablet TAKE 2 PILLS TWICE A DAY FOR 1 DAY AT FIRST SIGN OF COLD SORE 30 tablet 1   zolpidem (AMBIEN) 5 MG tablet Take 1 tablet (5 mg total) by mouth at bedtime as needed for sleep (do not drive for 8 hours after taking). 25 tablet 0   No current facility-administered medications for this visit.     Objective:  Ht 5\' 10"  (1.778 m)    Wt 208 lb (94.3 kg)    BMI 29.84 kg/m  self reported vitals  Nonlabored voice, normal speech      Assessment and Plan     Youngest moving to [place]-  just he and wife  # Upcoming travel and concern for traveler's diarrhea S:headed to [place], [place], [place] for about 3 weeks- will include some remote  areas included. Just he and his wife on the trip  Patient has traveled to [place] in the past and was given a prescription for ciprofloxacin for potential traveler's diarrhea A/P: We discussed there been some changes in guidelines and primarily treatment is only recommended if severe such as if there is fever or changes to the stool including blood, mucus, pus.  We also discussed the starting treatment now is azithromycin typically due to  better overall side effect profile-he is open to this-opted for azithromycin 1000 mg once if he meets the qualifications-we discussed potential risk of resistance to antibiotics and reasons for changes.  Discussed importance remaining well-hydrated if this were to occur  #Hypertension S: medication: None.  Previously planned to work on lifestyle and has been controlled at times Home readings #s: None recently-he does have a cough BP Readings from Last 3 Encounters:  02/13/20 (!) 134/97  01/10/20 124/82  12/11/19 122/64  A/P: Blood pressure was elevated at [facility] executive physical and at last visit here-patient plans to call back to schedule appointment after he gets back from his trip and encouraged home monitoring in the meantime -Recommended Dash diet  #hyperlipidemia S: Medication:rosuvastatin 10mg   Lab Results  Component Value Date   CHOL 172 12/07/2019   HDL 60 12/07/2019   Ray 92 12/07/2019   LDLDIRECT 115.6 01/02/2014   TRIG 107 12/07/2019   CHOLHDL 2.9 12/07/2019   A/P: Apparently had an elevated coronary artery calcium score in the past and was started on statin last visit  # BPH  S: Patient with some symptoms including nighttime frequency-improved with tamsulosin started by [facility] executive physical A/P: Improved on medication-continue current medication   Recommended follow up: Plans to call back to schedule visit after his trip  Lab/Order associations:   ICD-10-CM   1. Essential hypertension  I10     2. BPH associated with nocturia  N40.1    R35.1     3. Hyperlipidemia, unspecified hyperlipidemia type  E78.5       Meds ordered this encounter  Medications   azithromycin (ZITHROMAX) 250 MG tablet    Sig: Take 4 tablets all at once for travelers diarrhea if you have fever OR blood, pus or mucus in the stool.    Dispense:  4 tablet    Refill:  0    Return precautions advised.  Garret Reddish, MD

## 2021-06-02 ENCOUNTER — Telehealth: Payer: Self-pay | Admitting: Pulmonary Disease

## 2021-06-02 NOTE — Telephone Encounter (Signed)
Spoke with pt assistant who states pt needs Rx for current C-Pap to pay out of pocket for new machine. Pt is overdue for 12 month f/u and was scheduled for OV with Margrett Rud, NP on 06/23/21. Pt needs signed RX faxed to [phone]. Assistant informed if Rx approved by Dr. Elsworth Soho Rx would be signed and faxed. Assistant stated understanding.  ? ?Dr. Elsworth Soho please advise.  ?

## 2021-06-03 NOTE — Telephone Encounter (Signed)
Lm for patient.  

## 2021-06-04 NOTE — Telephone Encounter (Signed)
Curtis Noel, MD  Dierdre Highman, RN; [facility] Triage Pool Yesterday (8:49 AM)  ? ?Last seen 12/2019  ?Needs OV with review of dl to see if events controlled on current settings please  ?ATC patient. Per DPR left detailed message to let pt. Know he needs to be seen first before we can send the prescription. Patient is scheduled with katie already. Advised pt. To call back if he wants to be seen sooner. Nothing further needed at this time. ?

## 2021-06-23 ENCOUNTER — Encounter: Payer: Self-pay | Admitting: Nurse Practitioner

## 2021-06-23 ENCOUNTER — Ambulatory Visit (INDEPENDENT_AMBULATORY_CARE_PROVIDER_SITE_OTHER): Payer: Commercial Managed Care - PPO | Admitting: Nurse Practitioner

## 2021-06-23 VITALS — BP 150/88 | HR 68 | Ht 74.0 in | Wt 212.6 lb

## 2021-06-23 DIAGNOSIS — G4733 Obstructive sleep apnea (adult) (pediatric): Secondary | ICD-10-CM | POA: Diagnosis not present

## 2021-06-23 NOTE — Patient Instructions (Addendum)
Continue to use CPAP every night, minimum of 4-6 hours a night.  ?Change equipment every 30 days or as directed by DME. Wash your tubing with warm soap and water daily, hang to dry. Wash humidifier portion weekly.  ?Be aware of reduced alertness and do not drive or operate heavy machinery if experiencing this or drowsiness.  ?Exercise encouraged, as tolerated. ?Healthy weight management discussed.  ?Avoid or decrease alcohol consumption and medications that make you more sleepy, if possible. ?Notify if persistent daytime sleepiness occurs even with consistent use of CPAP. ? ?Mask fitting at [facility]. Someone will contact you for scheduling ? ?Follow up in 3 months with Katie Jahmiya Guidotti,NP or Dr. Elsworth Soho. Ok to do virtual with Ashlynn Gunnels,NP if pt prefers. If symptoms do not improve or worsen, please contact office for sooner follow up or seek emergency care. ?

## 2021-06-23 NOTE — Progress Notes (Signed)
? ?'@Patient'$  ID: Curtis Hendrix, male    DOB: 27-Oct-1960, 61 y.o.   MRN: [id number] ? ?Chief Complaint  ?Patient presents with  ? Follow-up  ? ? ?Referring provider: ?Marin Olp, MD ? ?HPI: ?61 year old male, never smoker followed for obstructive sleep apnea on CPAP.  He is a patient of Dr. Bari Mantis and last seen in office on 01/10/2020 by Warner Mccreedy NP.  Past medical history significant for hypertension, chronic prostatitis, HLD, depression, insomnia, BPH. ? ?TEST/EVENTS:  ?04/2014 PSG: AHI 28/hr, SpO2 low 85%. Moderate obstructive sleep apnea  ? ?06/23/2021: Today-follow-up ?Patient presents today for overdue follow-up.  He is currently on CPAP therapy and would like to obtain a prescription to buy 2 more CPAP's.  He travels frequently and has a house here in [place], at [employer] and as well as a farm house.  He currently has a CPAP through [place] which he only uses when he is in [place].  Reports that it is only a few days a month.  Otherwise he uses his mini CPAP machine which does not have any download information on.  He reports that he spends more of his time at his beach house or his farm house.  He is currently wanting to purchase 2 more CPAP machine so he does not have to move CPAP around from place to place.  He will continue to travel with his mini CPAP machine.  Reports that he has been feeling well and feels like he gets good benefit from the machine.  His wife tells him that he does not snore while he is using it.  He does currently use a fullface mask.  Denies any drowsy driving, daytime fatigue, morning headaches or narcolepsy.  Breathing is stable. ? ?Download limited today given his infrequent use of this machine. ?[facility] Download 03/03/2021-05/31/2021 CPAP auto 5-15 ?17/90 days used (19%); 14% >4 hours; av usage 5 hours 31 min ?Pressure median 6.8, 95th 10 ?Leaks median 0.2, 95th 20.8  ?AHI 5.1  ? ?No Known Allergies ? ?Immunization History  ?Administered Date(s) Administered  ? Influenza Split  12/28/2011, 12/16/2018  ? Influenza Whole 03/17/2003  ? Influenza,inj,Quad PF,6+ Mos 02/15/2014, 12/04/2014, 02/18/2016, 02/01/2018, 12/11/2019  ? Influenza-Unspecified 02/15/2014, 12/04/2014, 02/18/2016  ? Moderna Covid-19 Vaccine Bivalent Booster 55yr & up 01/28/2021  ? Moderna Sars-Covid-2 Vaccination 01/10/2020  ? PFIZER(Purple Top)SARS-COV-2 Vaccination 06/02/2019, 06/27/2019  ? Td 03/17/1999, 06/29/2008  ? Tdap 10/25/2018  ? Zoster Recombinat (Shingrix) 07/13/2016, 09/22/2016  ? Zoster, Live 06/25/2015  ? ? ?Past Medical History:  ?Diagnosis Date  ? Arthritis   ? Fracture of rib of left side 04/2013  ? Hearing difficulty   ? left ear-since college. high frequency loss.   ? Hypertension   ? Personal history of colonic adenomas 02/23/2013  ? 02/23/2013 6 small polyps removed, 5 recovered - all adenomas repeat colonoscopy 02/2016    ? Radiculopathy, lumbar region   ? Sleep apnea   ? mild-uses CPAP  ? ? ?Tobacco History: ?Social History  ? ?Tobacco Use  ?Smoking Status Never  ?Smokeless Tobacco Never  ? ?Counseling given: Not Answered ? ? ?Outpatient Medications Prior to Visit  ?Medication Sig Dispense Refill  ? buPROPion (WELLBUTRIN XL) 300 MG 24 hr tablet TAKE 1 TABLET BY MOUTH EVERY DAY 90 tablet 3  ? Multiple Vitamins-Minerals (ONE-A-DAY MENS HEALTH FORMULA) TABS Take by mouth.    ? valACYclovir (VALTREX) 1000 MG tablet TAKE 2 PILLS TWICE A DAY FOR 1 DAY AT FIRST SIGN OF COLD SORE 30  tablet 1  ? azithromycin (ZITHROMAX) 250 MG tablet Take 4 tablets all at once for travelers diarrhea if you have fever OR blood, pus or mucus in the stool. (Patient not taking: Reported on 06/23/2021) 4 tablet 0  ? rosuvastatin (CRESTOR) 10 MG tablet Take 10 mg by mouth daily.    ? tamsulosin (FLOMAX) 0.4 MG CAPS capsule Take 0.4 mg by mouth daily.    ? zolpidem (AMBIEN) 5 MG tablet Take 1 tablet (5 mg total) by mouth at bedtime as needed for sleep (do not drive for 8 hours after taking). (Patient not taking: Reported on 06/23/2021)  25 tablet 0  ? ?No facility-administered medications prior to visit.  ? ? ? ?Review of Systems:  ? ?Constitutional: No weight loss or gain, night sweats, fevers, chills, fatigue, or lassitude. ?HEENT: No headaches, difficulty swallowing, tooth/dental problems, or sore throat. No sneezing, itching, ear ache, nasal congestion, or post nasal drip ?CV:  No chest pain, orthopnea, PND, swelling in lower extremities, anasarca, dizziness, palpitations, syncope ?Resp: No shortness of breath with exertion or at rest. No excess mucus or change in color of mucus. No productive or non-productive. No hemoptysis. No wheezing.  No chest wall deformity ?Neuro: No dizziness or lightheadedness.  ?Psych: No depression or anxiety. Mood stable.  ? ? ? ?Physical Exam: ? ?BP (!) 150/88 (BP Location: Right Arm, Cuff Size: Normal)   Pulse 68   Ht '6\' 2"'$  (1.88 m)   Wt 212 lb 9.6 oz (96.4 kg)   SpO2 99%   BMI 27.30 kg/m?  ? ?GEN: Pleasant, interactive, well-appearing; in no acute distress ?HEENT:  Normocephalic and atraumatic. PERRLA. Sclera white. Nasal turbinates pink, moist and patent bilaterally. No rhinorrhea present. Oropharynx pink and moist, without exudate or edema. No lesions, ulcerations, or postnasal drip.  ?NECK:  Supple w/ fair ROM. No JVD present. Normal carotid impulses w/o bruits. Thyroid symmetrical with no goiter or nodules palpated. No lymphadenopathy.   ?CV: RRR, no m/r/g, no peripheral edema. Pulses intact, +2 bilaterally. No cyanosis, pallor or clubbing. ?PULMONARY:  Unlabored, regular breathing. Clear bilaterally A&P w/o wheezes/rales/rhonchi. No accessory muscle use. No dullness to percussion. ?GI: BS present and normoactive. Soft, non-tender to palpation.  ?Neuro: A/Ox3. No focal deficits noted.   ?Skin: Warm, no lesions or rashe ?Psych: Normal affect and behavior. Judgement and thought content appropriate.  ? ? ? ?Lab Results: ? ?CBC ?   ?Component Value Date/Time  ? WBC 4.6 12/07/2019 0959  ? RBC 4.32 12/07/2019  0959  ? HGB 14.9 12/07/2019 0959  ? HCT 43.2 12/07/2019 0959  ? PLT 245 12/07/2019 0959  ? MCV 100.0 12/07/2019 0959  ? MCH 34.5 (H) 12/07/2019 0959  ? MCHC 34.5 12/07/2019 0959  ? RDW 12.2 12/07/2019 0959  ? LYMPHSABS 1,297 12/07/2019 0959  ? MONOABS 0.4 06/12/2015 0907  ? EOSABS 51 12/07/2019 0959  ? BASOSABS 60 12/07/2019 0959  ? ? ?BMET ?   ?Component Value Date/Time  ? NA 134 (L) 12/07/2019 0959  ? K 4.1 12/07/2019 0959  ? CL 99 12/07/2019 0959  ? CO2 27 12/07/2019 0959  ? GLUCOSE 77 12/07/2019 0959  ? BUN 11 12/07/2019 0959  ? CREATININE 1.00 12/07/2019 0959  ? CALCIUM 9.6 12/07/2019 0959  ? GFRNONAA 85.06 06/25/2009 0923  ? GFRAA 117 06/06/2007 0904  ? ? ?BNP ?No results found for: BNP ? ? ?Imaging: ? ?No results found. ? ? ? ?   ? View : No data to display.  ?  ?  ?  ? ? ?  No results found for: NITRICOXIDE ? ? ? ? ? ?Assessment & Plan:  ? ?OSA (obstructive sleep apnea) ?Reports compliance with CPAP on most nights; receives good benefit.  Unable to obtain much information given the fact that he primarily uses his mini CPAP and only uses his CPAP supplied through DME on nights he is in [place], which is not frequent.  Advised that he move this machine to one of his locations that he is at more frequently so we can better assess control of his OSA.  Current download shows breakthrough mild OSA with AHI 5.1.  He does have some mask leaks.  He would like to see about getting a different mask.  Mask desensitization fitting ordered today.  Provided with Rx so he can purchase 2 more CPAP machines to have at each of his homes. Advised we need to follow up in 3 months to ensure that his OSA is appropriately controlled and that he doesn't need settings adjusted. We discussed how untreated sleep apnea puts an individual at risk for cardiac arrhthymias, pulm HTN, DM, stroke and increases their risk for daytime accidents.  ? ? ?Patient Instructions  ?Continue to use CPAP every night, minimum of 4-6 hours a night.   ?Change equipment every 30 days or as directed by DME. Wash your tubing with warm soap and water daily, hang to dry. Wash humidifier portion weekly.  ?Be aware of reduced alertness and do not drive or operate

## 2021-06-23 NOTE — Assessment & Plan Note (Addendum)
Reports compliance with CPAP on most nights; receives good benefit.  Unable to obtain much information given the fact that he primarily uses his mini CPAP and only uses his CPAP supplied through DME on nights he is in [place], which is not frequent.  Advised that he move this machine to one of his locations that he is at more frequently so we can better assess control of his OSA.  Current download shows breakthrough mild OSA with AHI 5.1.  He does have some mask leaks.  He would like to see about getting a different mask.  Mask desensitization fitting ordered today.  Provided with Rx so he can purchase 2 more CPAP machines to have at each of his homes. Advised we need to follow up in 3 months to ensure that his OSA is appropriately controlled and that he doesn't need settings adjusted. We discussed how untreated sleep apnea puts an individual at risk for cardiac arrhthymias, pulm HTN, DM, stroke and increases their risk for daytime accidents.  ? ? ?Patient Instructions  ?Continue to use CPAP every night, minimum of 4-6 hours a night.  ?Change equipment every 30 days or as directed by DME. Wash your tubing with warm soap and water daily, hang to dry. Wash humidifier portion weekly.  ?Be aware of reduced alertness and do not drive or operate heavy machinery if experiencing this or drowsiness.  ?Exercise encouraged, as tolerated. ?Healthy weight management discussed.  ?Avoid or decrease alcohol consumption and medications that make you more sleepy, if possible. ?Notify if persistent daytime sleepiness occurs even with consistent use of CPAP. ? ?Mask fitting at [facility]. Someone will contact you for scheduling ? ?Follow up in 3 months with Katie Ouida Abeyta,NP or Dr. Elsworth Soho. Ok to do virtual with Marilyn Wing,NP if pt prefers. If symptoms do not improve or worsen, please contact office for sooner follow up or seek emergency care. ? ? ?

## 2021-07-30 ENCOUNTER — Telehealth: Payer: Self-pay | Admitting: Family Medicine

## 2021-07-30 NOTE — Telephone Encounter (Signed)
See below

## 2021-07-30 NOTE — Telephone Encounter (Signed)
He has had some numbness issues for several months per the notes but sounds like the face is new ? ?Unless he is having worsening symptoms reasonable to be seen on the 22nd-if worsening can be triaged and we can reassess.  In regards to her blood pressure-giving the medicine at least several days may be preferable to see if it comes down-have him checked daily and bring in a copy as well as his blood pressure cuff ?

## 2021-07-30 NOTE — Telephone Encounter (Signed)
Pt was seen in urgent care yesterday for high blood pressure and numbness in left leg, arm, and left side of the face. He was urged to see Yong Channel this week. I did not see any thing available until 08/04/21 at 11:20. Do we need to see him sooner and if so, where can I put him? ? ?Pharmacist, community at [facility] ?Presenter, broadcasting at [facility] [employer] ?Provider Garret Reddish- MD ?Contact Type Call ?Who Is Calling Patient / Member / Family / Caregiver ?Caller Name Shi Blankenship ?Caller Phone Number [phone] ?Patient Name Curtis Hendrix ?Patient DOB [birth year] ?Call Type Message Only Information Provided ?Reason for Call Request to Schedule Office Appointment ?Initial Comment Caller states that he went to urgent care today for elevated blood pressure. The reading was ?161/94. He also has some numbness in left arm, leg, and cheek. They gave him some medicine ?and recommended that he see his provider this week. He ?Patient request to speak to RN No ?Additional Comment Triage declined. Office hours provided. ?Disp. Time Disposition Final User ?07/29/2021 6:05:04 PM General Information Provided Yes Serita Sheller ?Call Closed By: Serita Sheller ?Transaction Date/Time: 07/29/2021 6:00:48 PM (ET) ?

## 2021-08-01 ENCOUNTER — Ambulatory Visit (INDEPENDENT_AMBULATORY_CARE_PROVIDER_SITE_OTHER): Payer: Commercial Managed Care - PPO | Admitting: Psychology

## 2021-08-01 DIAGNOSIS — F1010 Alcohol abuse, uncomplicated: Secondary | ICD-10-CM

## 2021-08-01 NOTE — Progress Notes (Signed)
[place] Counselor Initial Adult Exam  Name: Curtis Hendrix Date: 08/01/2021 MRN: [id number] DOB: [birth year] PCP: Marin Olp, MD  Time Spent:8:30  am - 9:30 am: 75 Minutes   [name] participated from home, via video, and consented to treatment. Therapist participated from home office. We met online due to [place] pandemic.   Guardian/Payee:  N/A    Paperwork requested: No   Reason for Visit /Presenting Problem: alcohol abuse  Mental Status Exam: Appearance:   Well Groomed     Behavior:  Appropriate  Motor:  Normal  Speech/Language:   Normal Rate  Affect:  Appropriate  Mood:  normal  Thought process:  normal  Thought content:    WNL  Sensory/Perceptual disturbances:    WNL  Orientation:  oriented to person, place, and situation  Attention:  Good  Concentration:  Good  Memory:  WNL  Fund of knowledge:   Good  Insight:    Good  Judgment:   Good  Impulse Control:  Fair    Reported Symptoms:  Alcohol abuse, marital discord, anxiety  Risk Assessment: Danger to Self:  No Self-injurious Behavior: No Danger to Others: No Duty to Warn:no Physical Aggression / Violence:No  Access to Firearms a concern:  unknown Gang Involvement:No  Patient / guardian was educated about steps to take if suicide or homicide risk level increases between visits: n/a While future psychiatric events cannot be accurately predicted, the patient does not currently require acute inpatient psychiatric care and does not currently meet [facility] involuntary commitment criteria.  Substance Abuse History: Current substance abuse: Yes     Past Psychiatric History:   Previous psychological history is significant for alcohol abuse Outpatient Providers:David Gutterman, Ph.D. History of Psych Hospitalization: No  Psychological Testing:  N/A    Abuse History:  Victim of: No.,  N/A    Report needed: No. Victim of Neglect:No. Perpetrator of  N/A   Witness / Exposure to  Domestic Violence: No   Protective Services Involvement: No  Witness to [employer] Violence:  No   Family History:  Family History  Problem Relation Age of Onset   Transient ischemic attack Mother        age 59   Bladder Cancer Father    Kidney cancer Father    Kidney cancer Brother        renal cell carcinoma age 59 blood in urine   Colon cancer Neg Hx    Esophageal cancer Neg Hx    Rectal cancer Neg Hx    Stomach cancer Neg Hx    Colon polyps Neg Hx     Living situation: the patient lives with their spouse  Sexual Orientation: Straight  Relationship Status: married  Name of spouse / other:[name] If a parent, number of children / ages:3 adult children  Support Systems: spouse  Museum/gallery curator Stress:  Yes   Income/Employment/Disability: Employment  Armed forces logistics/support/administrative officer: No   Educational History: Education: Scientist, product/process development: unknown  Any cultural differences that may affect / interfere with treatment:  not applicable   Recreation/Hobbies: fishing  Stressors: Marital or family conflict    Strengths: Supportive Relationships, Family, and Spirituality  Barriers:  unidentified   Legal History: Pending legal issue / charges: The patient has no significant history of legal issues. History of legal issue / charges:  N/A  Medical History/Surgical History: not reviewed Past Medical History:  Diagnosis Date   Arthritis    Fracture of rib of left side 04/2013  Hearing difficulty    left ear-since college. high frequency loss.    Hypertension    Personal history of colonic adenomas 02/23/2013   02/23/2013 6 small polyps removed, 5 recovered - all adenomas repeat colonoscopy 02/2016     Radiculopathy, lumbar region    Sleep apnea    mild-uses CPAP    Past Surgical History:  Procedure Laterality Date   COLONOSCOPY  04/14/2016   [facility]   lumbar lamninectomy  2008   l4-l5 for drop foot   POLYPECTOMY     TONSILLECTOMY      TYMPANOSTOMY     tubes as child   WISDOM TOOTH EXTRACTION      Medications: Current Outpatient Medications  Medication Sig Dispense Refill   azithromycin (ZITHROMAX) 250 MG tablet Take 4 tablets all at once for travelers diarrhea if you have fever OR blood, pus or mucus in the stool. (Patient not taking: Reported on 06/23/2021) 4 tablet 0   buPROPion (WELLBUTRIN XL) 300 MG 24 hr tablet TAKE 1 TABLET BY MOUTH EVERY DAY 90 tablet 3   Multiple Vitamins-Minerals (ONE-A-DAY MENS HEALTH FORMULA) TABS Take by mouth.     rosuvastatin (CRESTOR) 10 MG tablet Take 10 mg by mouth daily.     tamsulosin (FLOMAX) 0.4 MG CAPS capsule Take 0.4 mg by mouth daily.     valACYclovir (VALTREX) 1000 MG tablet TAKE 2 PILLS TWICE A DAY FOR 1 DAY AT FIRST SIGN OF COLD SORE 30 tablet 1   zolpidem (AMBIEN) 5 MG tablet Take 1 tablet (5 mg total) by mouth at bedtime as needed for sleep (do not drive for 8 hours after taking). (Patient not taking: Reported on 06/23/2021) 25 tablet 0   No current facility-administered medications for this visit.    No Known Allergies Initial Session Note:He states that his drinking has resurfaced as an issue. 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 He and his wife took a trip together and had been doing great. 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 He is now only working 1 1/2 days/week. Says he has not really worked out how to "retire" and spend his time. On their trip to [place], he and a friend went on a 2 day "bender" and he says he was not a good husband during those 2 days. She told him she was not willing to be with him if this continues. During a subsequent trip to [place], she told him she would leave if he wants to "be that way". They had a good trip together and went on another "great trip". A week ago Saturday, they had a party at their farm and had a great time. He says they have had "magical" conversations. He had a  wedding for daughter last week at [employer] and he vowed to minimize drinking. He states there was a number of "mishaps" and he ended up getting drunk on Thursday. Wife was upset, but he did well the next couple of days. He has met with his priest and will be meeting regularly with him. His wife has started a Forensic psychologist (third one) and is very busy. He has not figured out how to spend his time and wants guidance about his next steps. They were hoping to retire at 49, but she just turned 63 and is "all about work". 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 That involvement, however, is minimal. His  wife's business keeps her very involved with time and travel. He wants to nurture their relationship. He says that his wife is with some very "high level" people and he wants to be connected with her. He says wife told him she doesn't want to be married to "some screwball" who just hangs around drinking beer. He reports that he is very much in love with his wife and wants to make sure he remains attractive to his wife. He is reading "Let your Life Speak".  [name] is sitting on boards and is working with some highly successful public figures and he says "being an Clyde who is hanging out and drinking beer" is not what she wants or who he wants to be. They have been married for 41 years and says "I don't want to screw it up".  He has had some health issues (high blood pressure) and numbness in arm. Also had heart palpitations. He is consulting with his doctor.  He says "we have a new chapter" and wants to get himself and marriage back on track. His wife is in full support of Clair Gulling getting some help/support.  Tx. Plan: Outpatient psychotherapy to develop strategies to reduce and manage alcohol consumption. Will also employ psychodynamic interventions to better understand patients tendency to mask feelings with substances. Will address marital relationship issues with potential conjoint  sessions. Goal date is 12-23.  Diagnoses:  Alcohol Abuse; Marital Discord  Plan of Care: Outpatient Psychotherapy   Marcelina Morel, PhD  8:30a-9:30a 60 minutes

## 2021-08-04 ENCOUNTER — Encounter: Payer: Self-pay | Admitting: Family Medicine

## 2021-08-04 ENCOUNTER — Ambulatory Visit (INDEPENDENT_AMBULATORY_CARE_PROVIDER_SITE_OTHER): Payer: Commercial Managed Care - PPO | Admitting: Family Medicine

## 2021-08-04 VITALS — BP 144/70 | HR 56 | Temp 98.2°F | Ht 74.0 in | Wt 211.0 lb

## 2021-08-04 DIAGNOSIS — R202 Paresthesia of skin: Secondary | ICD-10-CM

## 2021-08-04 DIAGNOSIS — E785 Hyperlipidemia, unspecified: Secondary | ICD-10-CM

## 2021-08-04 DIAGNOSIS — I10 Essential (primary) hypertension: Secondary | ICD-10-CM

## 2021-08-04 DIAGNOSIS — R200 Anesthesia of skin: Secondary | ICD-10-CM

## 2021-08-04 LAB — COMPREHENSIVE METABOLIC PANEL
ALT: 24 U/L (ref 0–53)
AST: 24 U/L (ref 0–37)
Albumin: 4.6 g/dL (ref 3.5–5.2)
Alkaline Phosphatase: 48 U/L (ref 39–117)
BUN: 10 mg/dL (ref 6–23)
CO2: 29 mEq/L (ref 19–32)
Calcium: 9.6 mg/dL (ref 8.4–10.5)
Chloride: 99 mEq/L (ref 96–112)
Creatinine, Ser: 0.86 mg/dL (ref 0.40–1.50)
GFR: 94.91 mL/min (ref >60.00)
Glucose, Bld: 104 mg/dL — ABNORMAL HIGH (ref 70–99)
Potassium: 4.4 mEq/L (ref 3.5–5.1)
Sodium: 134 mEq/L — ABNORMAL LOW (ref 135–145)
Total Bilirubin: 0.9 mg/dL (ref 0.2–1.2)
Total Protein: 7.4 g/dL (ref 6.0–8.3)

## 2021-08-04 LAB — CBC WITH DIFFERENTIAL/PLATELET
Basophils Absolute: 0 10*3/uL (ref 0.0–0.1)
Basophils Relative: 1 % (ref 0.0–3.0)
Eosinophils Absolute: 0 10*3/uL (ref 0.0–0.7)
Eosinophils Relative: 1.2 % (ref 0.0–5.0)
HCT: 41.8 % (ref 39.0–52.0)
Hemoglobin: 14.3 g/dL (ref 13.0–17.0)
Lymphocytes Relative: 26.9 % (ref 12.0–46.0)
Lymphs Abs: 1 10*3/uL (ref 0.7–4.0)
MCHC: 34.2 g/dL (ref 30.0–36.0)
MCV: 97.6 fl (ref 78.0–100.0)
Monocytes Absolute: 0.4 10*3/uL (ref 0.1–1.0)
Monocytes Relative: 12.3 % — ABNORMAL HIGH (ref 3.0–12.0)
Neutro Abs: 2.1 10*3/uL (ref 1.4–7.7)
Neutrophils Relative %: 58.6 % (ref 43.0–77.0)
Platelets: 207 10*3/uL (ref 150.0–400.0)
RBC: 4.29 Mil/uL (ref 4.22–5.81)
RDW: 13.4 % (ref 11.5–15.5)
WBC: 3.6 10*3/uL — ABNORMAL LOW (ref 4.0–10.5)

## 2021-08-04 LAB — LIPID PANEL
Cholesterol: 170 mg/dL (ref 0–200)
HDL: 75.7 mg/dL (ref >39.00)
LDL Cholesterol: 68 mg/dL (ref 0–99)
NonHDL: 94.58
Total CHOL/HDL Ratio: 2
Triglycerides: 131 mg/dL (ref 0.0–149.0)
VLDL: 26.2 mg/dL (ref 0.0–40.0)

## 2021-08-04 LAB — VITAMIN B12: Vitamin B-12: 370 pg/mL (ref 211–911)

## 2021-08-04 LAB — TSH: TSH: 0.85 u[IU]/mL (ref 0.35–5.50)

## 2021-08-04 MED ORDER — LISINOPRIL 10 MG PO TABS: 10.0000 mg | ORAL_TABLET | Freq: Every day | ORAL | 3 refills | Status: DC

## 2021-08-04 MED ORDER — BUPROPION HCL ER (XL) 300 MG PO TB24: 300.0000 mg | ORAL_TABLET | Freq: Every day | ORAL | 3 refills | Status: DC

## 2021-08-04 MED ORDER — TAMSULOSIN HCL 0.4 MG PO CAPS: 0.4000 mg | ORAL_CAPSULE | Freq: Every day | ORAL | 3 refills | Status: DC

## 2021-08-04 MED ORDER — ROSUVASTATIN CALCIUM 10 MG PO TABS: 10.0000 mg | ORAL_TABLET | Freq: Every day | ORAL | 3 refills | Status: DC

## 2021-08-04 MED ORDER — HYDROXYZINE PAMOATE 25 MG PO CAPS: 25.0000 mg | ORAL_CAPSULE | Freq: Three times a day (TID) | ORAL | 3 refills | Status: DC | PRN

## 2021-08-04 NOTE — Progress Notes (Signed)
Phone [phone] In person visit   Subjective:   Curtis Hendrix is a 61 y.o. year old very pleasant male patient who presents for/with See problem oriented charting Chief Complaint  Patient presents with   Follow-up    Pt was seen in urgent care on 05/16/823   Hypertension    His bp has been ranging from 115/73 p 92 last night and this morning 155/105 p 67 and 174/95 p 61.   Numbness    Pt c/o numbness located in the back of his left leg he thinks is due to back surgery and alos in his lower left arm and thumb. Has had ekg done and stroke rule out.    Past Medical History-  Patient Active Problem List   Diagnosis Date Noted   BPH associated with nocturia 06/25/2015    Priority: Medium    OSA (obstructive sleep apnea) 02/15/2014    Priority: Medium    Depression 11/07/2013    Priority: Medium    Oral herpes simplex infection 11/07/2013    Priority: Medium    Hyperlipidemia 09/09/2007    Priority: Medium    Essential hypertension 06/10/2007    Priority: Medium    Insomnia 12/18/2013    Priority: Low   History of colonic polyps 02/23/2013    Priority: Low   Chronic prostatitis 09/09/2007    Priority: Low   CHEST PAIN UNSPECIFIED 07/19/2007    Priority: [place] maintenance 01/10/2020    Medications- reviewed and updated Current Outpatient Medications  Medication Sig Dispense Refill   hydrOXYzine (VISTARIL) 25 MG capsule Take 1 capsule (25 mg total) by mouth every 8 (eight) hours as needed (do not drive for 8 hours after taking). 30 capsule 3   Multiple Vitamins-Minerals (ONE-A-DAY MENS HEALTH FORMULA) TABS Take by mouth.     valACYclovir (VALTREX) 1000 MG tablet TAKE 2 PILLS TWICE A DAY FOR 1 DAY AT FIRST SIGN OF COLD SORE 30 tablet 1   buPROPion (WELLBUTRIN XL) 300 MG 24 hr tablet Take 1 tablet (300 mg total) by mouth daily. 90 tablet 3   lisinopril (ZESTRIL) 10 MG tablet Take 1 tablet (10 mg total) by mouth daily. 90 tablet 3   rosuvastatin (CRESTOR) 10 MG  tablet Take 1 tablet (10 mg total) by mouth daily. 90 tablet 3   tamsulosin (FLOMAX) 0.4 MG CAPS capsule Take 1 capsule (0.4 mg total) by mouth daily. 90 capsule 3   zolpidem (AMBIEN) 5 MG tablet Take 1 tablet (5 mg total) by mouth at bedtime as needed for sleep (do not drive for 8 hours after taking). (Patient not taking: Reported on 06/23/2021) 25 tablet 0   No current facility-administered medications for this visit.     Objective:  BP (!) 144/70   Pulse (!) 56   Temp 98.2 F (36.8 C)   Ht '6\' 2"'$  (1.88 m)   Wt 211 lb (95.7 kg)   SpO2 99%   BMI 27.09 kg/m  Gen: NAD, resting comfortably CV: RRR no murmurs rubs or gallops Lungs: CTAB no crackles, wheeze, rhonchi Abdomen: soft/nontender/nondistended/normal bowel sounds. No rebound or guarding.  Ext: no edema Skin: warm, dry Neuro: CN II-XII intact, sensation and reflexes normal throughout, 5/5 muscle strength in bilateral upper and lower extremities. Normal finger to nose. Normal rapid alternating movements. No pronator drift. Normal romberg. Normal gait.  Bilateral forearms with similar touch to monofilament and cold     Assessment and Plan   #Left arm numbness and tingling-several months #  Left leg numbness and tingling-intermittent issues since prior surgery-goes down the back of his left leg into the calf but stops before ankle #Left facial tingling-one-time episode that lasted several hours after left arm numbness have intensified S: Patient was seen in urgent care on 07/29/2021.  Blood pressure was running higher and noted numbness in his left leg, arm and left side of the face.  He was advised to follow-up with PCP.  They did not suspect stroke.  Worsening of symptoms started 3 days prior to visit and actually reported that they have been going on for 5 months but have been more intense.  Had reassuring EKG read as normal sinus rhythm with no ST or T wave changes  I was able to view his EKG based on his phone mychart access and  appeared stbale from 03/30/14. Neurological exam normal.  He was started on blood pressure medication for elevated blood pressure reading.  He since that time has started taking lisinopril.Takes lisinopril in the AM- blood pressures are higher in morning when he takes it.   Left arm sensation is pretty constant for last 3 months but can be very low level at times unless he gets distracted.  - no chest pain or shortness of breath -Occasional twinge in neck -Sleeps on left side-wonders if could have tweaked at the beach -Occasionally pain up into his shoulder.  Also reports what sounds to be [facility] joint issues-she reports Separated shoulder- seeing DR. Supple in 2 weeks.   Patient does report multiple stressors and is concerned anxiety could be playing a role.  His daughter got married in the last few weeks. He reports had poor sleep last week and had some anxiety about tingling in left arm. Was driving back from beach to see a friend- issues started to intensify- felt it in his arm and into his face. He noted when skiing in January- into his left leg with skiing (history of laminectomy L4-L5). The left leg issue has been a chronic issue with history of back issues/surgery. Noted steadiness issue standing on just left leg during yoga 1 day but not a persistent issue.He thinks arm issue was at least 3 months into forearm and into the thumb. Last week had noted some pulsations during a gentle walk on beach with dog (abnormal for him). Did miss cpap for some of wedding nights.   Figuring out next steps in life- feels alcohol has been higher but still average probably 12 a week per his report. Wife working hard- hard balance for him. 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  Reassuring neurological exam again today -For left forearm numbness and tingling we will start with cervical spine films-strongly considering short course of prednisone-wonder about irritated nerve root/bulging disc and if no improvement consider sports medicine or orthopedic referral though he is also seeing Dr. Onnie Graham in 2 weeks about his shoulder and he would prefer to have that visit first -Also has upcoming visit with [facility].  I do not strongly suspect cardiac  cause of his symptoms-CT cardiac scoring within 4 years under 200 and on statin therapy we can get their opinion -Suspect left leg issues could be related to prior laminectomy-we did not pursue further work-up at this time since the arm issues seem to be the most prevalent/persistent -Could also consider sports medicine or neurology consult depending on opinions of his [place] and Dr. Onnie Graham -Discussed new or worsening symptoms particularly chest pain shortness of breath or weakness to seek care in emergency room  We do think anxiety is playing a role in his symptoms-see depression section below  #hypertension S: medication: Was started on lisinopril 10 mg on 07/29/2021-previously had wanted to work on home monitoring and lifestyle changes Home readings #s: Blood pressure was high this morning before he took his lisinopril but improved last night.  He also reports increased blood pressure with increased anxiety and has been more stressed with visit upcoming today BP Readings from Last 3 Encounters:  08/04/21 (!) 144/70  06/23/21 (!) 150/88  02/13/20 (!) 134/97  A/P: Poorly controlled hypertension and I believe it is time to start medicine-can reassess when anxiety is better because I think that  is certainly contributing.  I think lisinopril is a reasonable first choice.  We will continue this and asked him to update me on blood pressures in a few weeks-also want to address anxiety better and see if that is helpful.  Discussed blood pressure goals prefer less than 135/85 at home  #hyperlipidemia S: Medication: Rosuvastatin 10 mg - calcium score 159 in 2019.  Lab Results  Component Value Date   CHOL 170 08/04/2021   HDL 75.70 08/04/2021   LDLCALC 68 08/04/2021   LDLDIRECT 115.6 01/02/2014   TRIG 131.0 08/04/2021   CHOLHDL 2 08/04/2021   A/P: No recent lipids here-prefer for LDL to be under 70-do not strongly suspect cardiac cause but we discussed if new or worsening symptoms should still seek care -Suspect well-controlled-I refilled medication for him  # Depression with recent anxiety S: Medication: wellbutrin 300 mg XR. No SI - has some increased anxiety-see discussion above about current stressors    08/04/2021   11:29 AM 04/24/2021   11:17 AM 12/11/2019    2:53 PM  Depression screen PHQ 2/9  Decreased Interest 0 0 0  Down, Depressed, Hopeless 0 0 0  PHQ - 2 Score 0 0 0  Altered sleeping 3 0 0  Tired, decreased energy 0 0 0  Change in appetite 0 0 0  Feeling bad or failure about yourself  0 0 0  Trouble concentrating 0 0 0  Moving slowly or fidgety/restless 0 0 0  Suicidal thoughts 0 0 0  PHQ-9 Score 3 0 0  Difficult doing work/chores Not difficult at all Not difficult at all    A/P: Depression is controlled but anxiety appears poorly controlled.  We discussed Wellbutrin can potentially affect blood pressure and increased anxiety-discussed possibly reducing dose in the future but he wanted to hold off for now.  We did opt for short-term trial of hydroxyzine to see if helpful for anxiety  #BPH-I am willing to refill Flomax and did so today   Recommended follow up: Return for next already scheduled visit or sooner if needed.  Plus we will update me on his upcoming  visits Future Appointments  Date Time Provider [place]  08/05/2021 12:00 PM Deneise Lever, MD [facility]  09/24/2021  2:00 PM Clayton Bibles, NP [facility] None  12/26/2021  1:00  PM Marin Olp, MD [facility]    Lab/Order associations: 2 tangerines and an orange   ICD-10-CM   1. Paresthesias  R20.2 TSH    Vitamin B12    DG Cervical Spine Complete    2. Essential hypertension  I10 CBC with Differential/Platelet    Comprehensive metabolic panel    3. Hyperlipidemia, unspecified hyperlipidemia type  E78.5 CBC with Differential/Platelet    Comprehensive metabolic panel    Lipid panel    4. Numbness and tingling in left arm  R20.0 DG Cervical Spine Complete   R20.2       Meds ordered this encounter  Medications   hydrOXYzine (VISTARIL) 25 MG capsule    Sig: Take 1 capsule (25 mg total) by mouth every 8 (eight) hours as needed (do not drive for 8 hours after taking).    Dispense:  30 capsule    Refill:  3   buPROPion (WELLBUTRIN XL) 300 MG 24 hr tablet    Sig: Take 1 tablet (300 mg total) by mouth daily.    Dispense:  90 tablet    Refill:  3   lisinopril (ZESTRIL) 10 MG tablet    Sig: Take 1 tablet (10 mg total) by mouth daily.    Dispense:  90 tablet    Refill:  3   rosuvastatin (CRESTOR) 10 MG tablet    Sig: Take 1 tablet (10 mg total) by mouth daily.    Dispense:  90 tablet    Refill:  3   tamsulosin (FLOMAX) 0.4 MG CAPS capsule    Sig: Take 1 capsule (0.4 mg total) by mouth daily.    Dispense:  90 capsule    Refill:  3    Time Spent: 43 minutes of total time (11:57 AM-12:40 PM) was spent on the date of the encounter performing the following actions: chart review prior to seeing the patient, obtaining history, performing a medically necessary exam including neurological exam, counseling on the potential causes of his symptoms and treatment plan and emergency precautions-also counseling on stress, placing orders, and documenting in our EHR.     Return precautions advised.  Garret Reddish, MD

## 2021-08-04 NOTE — Assessment & Plan Note (Signed)
#  hypertension S: medication: Was started on lisinopril 10 mg on 07/29/2021-previously had wanted to work on home monitoring and lifestyle changes Home readings #s: Blood pressure was high this morning before he took his lisinopril but improved last night.  He also reports increased blood pressure with increased anxiety and has been more stressed with visit upcoming today BP Readings from Last 3 Encounters:  08/04/21 (!) 144/70  06/23/21 (!) 150/88  02/13/20 (!) 134/97  A/P: Poorly controlled hypertension and I believe it is time to start medicine-can reassess when anxiety is better because I think that is certainly contributing.  I think lisinopril is a reasonable first choice.  We will continue this and asked him to update me on blood pressures in a few weeks-also want to address anxiety better and see if that is helpful.  Discussed blood pressure goals prefer less than 135/85 at home

## 2021-08-04 NOTE — Patient Instructions (Addendum)
For high blood pressure treatment we want blood pressure <135/85. We also want to avoid lows- as low as 100/60 is ok as long as not feeling lightheaded/dizzy.  -continue lisinopril  Please stop by lab before you go If you have mychart- we will send your results within 3 business days of [place] receiving them.  If you do not have mychart- we will call you about results within 5 business days of [place] receiving them.  *please also note that you will see labs on mychart as soon as they post. I will later go in and write notes on them- will say "notes from Dr. Yong Channel"   Please go to [place]  central [facility] (updated 05/11/2019) - located [address] across the street from [place] - in the basement - Hours: 8:30-5:00 PM M-F (with lunch from 12:30- 1 PM). You do NOT need an appointment.  - Please ensure you are covid symptom free before going in(No fever, chills, cough, congestion, runny nose, shortness of breath, fatigue, body aches, sore throat, headache, nausea, vomiting, diarrhea, or new loss of taste or smell. No known contacts with covid 19 or someone being tested for covid 19)  Recommended follow up: Return for next already scheduled visit or sooner if needed. - please message me with how blood pressures are doing home

## 2021-08-04 NOTE — Addendum Note (Signed)
Addended by: Marin Olp on: 08/04/2021 05:22 PM   Modules accepted: Level of Service

## 2021-08-05 ENCOUNTER — Ambulatory Visit (INDEPENDENT_AMBULATORY_CARE_PROVIDER_SITE_OTHER)
Admission: RE | Admit: 2021-08-05 | Discharge: 2021-08-05 | Disposition: A | Discharge status: Home / Self Care | Payer: Commercial Managed Care - PPO | Source: Ambulatory Visit | Attending: Family Medicine | Admitting: Family Medicine

## 2021-08-05 ENCOUNTER — Ambulatory Visit (HOSPITAL_BASED_OUTPATIENT_CLINIC_OR_DEPARTMENT_OTHER): Payer: Commercial Managed Care - PPO | Attending: Nurse Practitioner | Admitting: Internal Medicine

## 2021-08-05 DIAGNOSIS — R202 Paresthesia of skin: Secondary | ICD-10-CM

## 2021-08-05 DIAGNOSIS — R200 Anesthesia of skin: Secondary | ICD-10-CM | POA: Diagnosis not present

## 2021-08-05 DIAGNOSIS — G4733 Obstructive sleep apnea (adult) (pediatric): Secondary | ICD-10-CM

## 2021-08-06 ENCOUNTER — Other Ambulatory Visit: Payer: Self-pay | Admitting: Family Medicine

## 2021-08-06 MED ORDER — PREDNISONE 20 MG PO TABS
ORAL_TABLET | ORAL | 0 refills | Status: DC
Start: 2021-08-06 — End: 2022-04-07

## 2021-08-14 ENCOUNTER — Telehealth: Payer: Self-pay | Admitting: Family Medicine

## 2021-08-14 NOTE — Telephone Encounter (Signed)
Called and lm on pt vm, when pt calls back please see what pt is wanting a call back regarding.

## 2021-08-14 NOTE — Telephone Encounter (Signed)
Pt is returning a call to [place]. He would like to go ahead and schedule the ultrasound. He would also like a call back.

## 2021-08-21 ENCOUNTER — Encounter: Payer: Self-pay | Admitting: Neurology

## 2021-08-21 ENCOUNTER — Other Ambulatory Visit: Payer: Self-pay

## 2021-08-21 ENCOUNTER — Encounter: Payer: Self-pay | Admitting: Family Medicine

## 2021-08-21 DIAGNOSIS — R202 Paresthesia of skin: Secondary | ICD-10-CM

## 2021-08-21 DIAGNOSIS — R200 Anesthesia of skin: Secondary | ICD-10-CM

## 2021-08-21 DIAGNOSIS — E785 Hyperlipidemia, unspecified: Secondary | ICD-10-CM

## 2021-08-22 ENCOUNTER — Encounter: Payer: Self-pay | Admitting: Family Medicine

## 2021-08-27 ENCOUNTER — Encounter: Payer: Self-pay | Admitting: Family Medicine

## 2021-08-28 ENCOUNTER — Other Ambulatory Visit: Payer: Self-pay | Admitting: Family Medicine

## 2021-08-28 DIAGNOSIS — R202 Paresthesia of skin: Secondary | ICD-10-CM

## 2021-08-28 DIAGNOSIS — I6529 Occlusion and stenosis of unspecified carotid artery: Secondary | ICD-10-CM

## 2021-08-29 NOTE — Telephone Encounter (Signed)
Patient called back in regards to scheduling a VV for his tick bite. At this time Dr. Yong Channel as well as all other providers at [facility] are booked. Would Dr. Yong Channel want to squeeze him in on his schedule today or allow him to see another provider for this? Please Advise.

## 2021-08-29 NOTE — Telephone Encounter (Signed)
Patient is currently out of town at [employer]. States he wanted to have a prescription called in for him instead of a VV. I was able to find an opening for Dr. Gena Fray at [facility] for 4:20 but am not sure if a virtual visit can be put in that slot as of yet.

## 2021-08-29 NOTE — Telephone Encounter (Signed)
We can only do in person. So unless he can go to a urgent care. We cannot send antibiotics in without seeing the pt.

## 2021-09-01 NOTE — Telephone Encounter (Signed)
FYI--On 09/01/21, I called patient back and explained that he would need to be seen in person to have a prescription filled. I also informed him that if we are unable to see him in person, his next option would be to go to urgent care.

## 2021-09-04 ENCOUNTER — Encounter (HOSPITAL_COMMUNITY): Payer: Commercial Managed Care - PPO

## 2021-09-08 ENCOUNTER — Ambulatory Visit (HOSPITAL_COMMUNITY)
Admission: RE | Admit: 2021-09-08 | Discharge: 2021-09-08 | Disposition: A | Discharge status: Home / Self Care | Payer: Commercial Managed Care - PPO | Source: Ambulatory Visit | Attending: Cardiology | Admitting: Cardiology

## 2021-09-08 DIAGNOSIS — R202 Paresthesia of skin: Secondary | ICD-10-CM | POA: Diagnosis present

## 2021-09-08 DIAGNOSIS — I6529 Occlusion and stenosis of unspecified carotid artery: Secondary | ICD-10-CM | POA: Diagnosis not present

## 2021-09-09 ENCOUNTER — Other Ambulatory Visit: Payer: Self-pay

## 2021-09-09 DIAGNOSIS — I771 Stricture of artery: Secondary | ICD-10-CM

## 2021-09-10 ENCOUNTER — Other Ambulatory Visit: Payer: Self-pay

## 2021-09-10 ENCOUNTER — Ambulatory Visit (INDEPENDENT_AMBULATORY_CARE_PROVIDER_SITE_OTHER): Payer: Commercial Managed Care - PPO | Admitting: Psychology

## 2021-09-10 DIAGNOSIS — F1010 Alcohol abuse, uncomplicated: Secondary | ICD-10-CM

## 2021-09-10 DIAGNOSIS — I771 Stricture of artery: Secondary | ICD-10-CM

## 2021-09-10 NOTE — Progress Notes (Signed)
[place] Counselor Initial Adult Exam  Name: Curtis Hendrix Date: 09/10/2021 MRN: [id number] DOB: 10-15-1960 PCP: Curtis Olp, MD     Curtis Hendrix participated from home, via video, and consented to treatment. Therapist participated from home office. We met online due to Curtis Hendrix pandemic.   Guardian/Payee:  N/A    Paperwork requested: No   Reason for Visit /Presenting Problem: alcohol abuse  Mental Status Exam: Appearance:   Well Groomed     Behavior:  Appropriate  Motor:  Normal  Speech/Language:   Normal Rate  Affect:  Appropriate  Mood:  normal  Thought process:  normal  Thought content:    WNL  Sensory/Perceptual disturbances:    WNL  Orientation:  oriented to person, place, and situation  Attention:  Good  Concentration:  Good  Memory:  WNL  Fund of knowledge:   Good  Insight:    Good  Judgment:   Good  Impulse Control:  Fair    Reported Symptoms:  Alcohol abuse, marital discord, anxiety  Risk Assessment: Danger to Self:  No Self-injurious Behavior: No Danger to Others: No Duty to Warn:no Physical Aggression / Violence:No  Access to Firearms a concern:  unknown Gang Involvement:No  Patient / guardian was educated about steps to take if suicide or homicide risk level increases between visits: n/a While future psychiatric events cannot be accurately predicted, the patient does not currently require acute inpatient psychiatric care and does not currently meet [facility] involuntary commitment criteria.  Substance Abuse History: Current substance abuse: Yes     Past Psychiatric History:   Previous psychological history is significant for alcohol abuse Outpatient Providers:Curtis Hendrix, Ph.D. History of Psych Hospitalization: No  Psychological Testing:  N/A    Abuse History:  Victim of: No.,  N/A    Report needed: No. Victim of Neglect:No. Perpetrator of  N/A   Witness / Exposure to Domestic  Violence: No   Protective Services Involvement: No  Witness to [employer] Violence:  No   Family History:  Family History  Problem Relation Age of Onset   Transient ischemic attack Mother        age 71   Bladder Cancer Father    Kidney cancer Father    Kidney cancer Brother        renal cell carcinoma age 77- blood in urine   Colon cancer Neg Hx    Esophageal cancer Neg Hx    Rectal cancer Neg Hx    Stomach cancer Neg Hx    Colon polyps Neg Hx     Living situation: the patient lives with their spouse  Sexual Orientation: Straight  Relationship Status: married  Name of spouse / other:Curtis Hendrix If a parent, number of children / ages:3 adult children  Support Systems: spouse  Museum/gallery curator Stress:  Yes   Income/Employment/Disability: Employment  Armed forces logistics/support/administrative officer: No   Educational History: Education: Scientist, product/process development: unknown  Any cultural differences that may affect / interfere with treatment:  not applicable   Recreation/Hobbies: fishing  Stressors: Marital or family conflict    Strengths: Supportive Relationships, Family, and Spirituality  Barriers:  unidentified   Legal History: Pending legal issue / charges: The patient has no significant history of legal issues. History of legal issue / charges:  N/A  Medical History/Surgical History: not reviewed Past Medical History:  Diagnosis Date   Arthritis    Fracture of  rib of left side 04/2013   Hearing difficulty    left ear-since college. high frequency loss.    Hypertension    Personal history of colonic adenomas 02/23/2013   02/23/2013 6 small polyps removed, 5 recovered - all adenomas repeat colonoscopy 02/2016     Radiculopathy, lumbar region    Sleep apnea    mild-uses CPAP    Past Surgical History:  Procedure Laterality Date   COLONOSCOPY  04/14/2016   [facility]   lumbar lamninectomy  2008   l4-l5 for drop foot   POLYPECTOMY     TONSILLECTOMY     TYMPANOSTOMY      tubes as child   WISDOM TOOTH EXTRACTION      Medications: Current Outpatient Medications  Medication Sig Dispense Refill   buPROPion (WELLBUTRIN XL) 300 MG 24 hr tablet Take 1 tablet (300 mg total) by mouth daily. 90 tablet 3   hydrOXYzine (VISTARIL) 25 MG capsule Take 1 capsule (25 mg total) by mouth every 8 (eight) hours as needed (do not drive for 8 hours after taking). 30 capsule 3   lisinopril (ZESTRIL) 10 MG tablet Take 1 tablet (10 mg total) by mouth daily. 90 tablet 3   Multiple Vitamins-Minerals (ONE-A-DAY MENS HEALTH FORMULA) TABS Take by mouth.     predniSONE (DELTASONE) 20 MG tablet Take 2 pills for 3 days, 1 pill for 4 days 10 tablet 0   rosuvastatin (CRESTOR) 10 MG tablet Take 1 tablet (10 mg total) by mouth daily. 90 tablet 3   tamsulosin (FLOMAX) 0.4 MG CAPS capsule Take 1 capsule (0.4 mg total) by mouth daily. 90 capsule 3   valACYclovir (VALTREX) 1000 MG tablet TAKE 2 PILLS TWICE A DAY FOR 1 DAY AT FIRST SIGN OF COLD SORE 30 tablet 1   zolpidem (AMBIEN) 5 MG tablet Take 1 tablet (5 mg total) by mouth at bedtime as needed for sleep (do not drive for 8 hours after taking). (Patient not taking: Reported on 06/23/2021) 25 tablet 0   No current facility-administered medications for this visit.    No Known Allergies Session Note: He states that things are MUCH better for him and marriage now than they were a month ago. He is using an app called "reframe" and is tracking his drinking. Has not been using an weed at all. He is still "adrift" in thinking about his future. He realizes that he has alienated "the most important person in his life" in the process of "drifting". He has met with his priest who has started a company called [employer]. They have had 2 coaching sessions and then 2 breathing sessions. He says that he s trying to "slow himself down". He says he got very emotional during the breathing exercise and was very in touch with grief and loss (father, best friend and  mother). He feels priority is now Curtis Hendrix and his next professional goals. Curtis Hendrix suggested that he focus on "where he gets his WOW at work. 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 His biggest fear is losing his marriage. Says kids are all doing well and everything else is in place.  We discussed the concept of addressing his fears with his marriage. Also, the need for him to initiate more activities with Curtis Hendrix. He is tracking his alcohol intake every day. Has greatly reduced beer intake.      Tx. Plan: Outpatient psychotherapy to develop strategies to reduce and manage alcohol consumption. Will also  employ psychodynamic interventions to better understand patients tendency to mask feelings with substances. Will address marital relationship issues with potential conjoint sessions. Goal date is 12-23.  Diagnoses:  Alcohol Abuse; Marital Discord, Generalized Anxiety  Plan of Care: Outpatient Psychotherapy   Marcelina Morel, PhD  8:30a-9:30a 60 minutes

## 2021-09-24 ENCOUNTER — Ambulatory Visit: Payer: Commercial Managed Care - PPO | Admitting: Nurse Practitioner

## 2021-11-12 ENCOUNTER — Ambulatory Visit (INDEPENDENT_AMBULATORY_CARE_PROVIDER_SITE_OTHER): Payer: Commercial Managed Care - PPO | Admitting: Psychology

## 2021-11-12 DIAGNOSIS — F1010 Alcohol abuse, uncomplicated: Secondary | ICD-10-CM

## 2021-11-12 NOTE — Progress Notes (Signed)
[place] Counselor Initial Adult Exam  Name: Curtis Hendrix Date: 11/12/2021 MRN: [id number] DOB: May 31, 1960 PCP: Marin Olp, MD     [name] participated from home, via video, and consented to treatment. Therapist participated from home office. We met online due to [employer] pandemic.   Guardian/Payee:  N/A    Paperwork requested: No   Reason for Visit /Presenting Problem: alcohol abuse  Mental Status Exam: Appearance:   Well Groomed     Behavior:  Appropriate  Motor:  Normal  Speech/Language:   Normal Rate  Affect:  Appropriate  Mood:  normal  Thought process:  normal  Thought content:    WNL  Sensory/Perceptual disturbances:    WNL  Orientation:  oriented to person, place, and situation  Attention:  Good  Concentration:  Good  Memory:  WNL  Fund of knowledge:   Good  Insight:    Good  Judgment:   Good  Impulse Control:  Fair    Reported Symptoms:  Alcohol abuse, marital discord, anxiety  Risk Assessment: Danger to Self:  No Self-injurious Behavior: No Danger to Others: No Duty to Warn:no Physical Aggression / Violence:No  Access to Firearms a concern:  unknown Gang Involvement:No  Patient / guardian was educated about steps to take if suicide or homicide risk level increases between visits: n/a While future psychiatric events cannot be accurately predicted, the patient does not currently require acute inpatient psychiatric care and does not currently meet [facility] involuntary commitment criteria.  Substance Abuse History: Current substance abuse: Yes     Past Psychiatric History:   Previous psychological history is significant for alcohol abuse Outpatient Providers:Tishana Clinkenbeard, Ph.D. History of Psych Hospitalization: No  Psychological Testing:  N/A    Abuse History:  Victim of: No.,  N/A    Report needed: No. Victim of Neglect:No. Perpetrator of  N/A    Witness / Exposure to Domestic Violence: No   Protective Services Involvement: No  Witness to [employer] Violence:  No   Family History:  Family History  Problem Relation Age of Onset   Transient ischemic attack Mother        age 25   Bladder Cancer Father    Kidney cancer Father    Kidney cancer Brother        renal cell carcinoma age 51- blood in urine   Colon cancer Neg Hx    Esophageal cancer Neg Hx    Rectal cancer Neg Hx    Stomach cancer Neg Hx    Colon polyps Neg Hx     Living situation: the patient lives with their spouse  Sexual Orientation: Straight  Relationship Status: married  Name of spouse / other:Curtis Hendrix If a parent, number of children / ages:3 adult children  Support Systems: spouse  Museum/gallery curator Stress:  Yes   Income/Employment/Disability: Employment  Armed forces logistics/support/administrative officer: No   Educational History: Education: Scientist, product/process development: unknown  Any cultural differences that may affect / interfere with treatment:  not applicable   Recreation/Hobbies: fishing  Stressors: Marital or family conflict    Strengths: Supportive Relationships, Family, and Spirituality  Barriers:  unidentified   Legal History: Pending legal issue / charges: The patient has no significant history of legal issues. History of legal issue / charges:  N/A  Medical History/Surgical History: not  reviewed Past Medical History:  Diagnosis Date   Arthritis    Fracture of rib of left side 04/2013   Hearing difficulty    left ear-since college. high frequency loss.    Hypertension    Personal history of colonic adenomas 02/23/2013   02/23/2013 6 small polyps removed, 5 recovered - all adenomas repeat colonoscopy 02/2016     Radiculopathy, lumbar region    Sleep apnea    mild-uses CPAP    Past Surgical History:  Procedure Laterality Date   COLONOSCOPY  04/14/2016   [facility]   lumbar lamninectomy  2008   l4-l5 for drop foot   POLYPECTOMY      TONSILLECTOMY     TYMPANOSTOMY     tubes as child   WISDOM TOOTH EXTRACTION      Medications: Current Outpatient Medications  Medication Sig Dispense Refill   buPROPion (WELLBUTRIN XL) 300 MG 24 hr tablet Take 1 tablet (300 mg total) by mouth daily. 90 tablet 3   hydrOXYzine (VISTARIL) 25 MG capsule Take 1 capsule (25 mg total) by mouth every 8 (eight) hours as needed (do not drive for 8 hours after taking). 30 capsule 3   lisinopril (ZESTRIL) 10 MG tablet Take 1 tablet (10 mg total) by mouth daily. 90 tablet 3   Multiple Vitamins-Minerals (ONE-A-DAY MENS HEALTH FORMULA) TABS Take by mouth.     predniSONE (DELTASONE) 20 MG tablet Take 2 pills for 3 days, 1 pill for 4 days 10 tablet 0   rosuvastatin (CRESTOR) 10 MG tablet Take 1 tablet (10 mg total) by mouth daily. 90 tablet 3   tamsulosin (FLOMAX) 0.4 MG CAPS capsule Take 1 capsule (0.4 mg total) by mouth daily. 90 capsule 3   valACYclovir (VALTREX) 1000 MG tablet TAKE 2 PILLS TWICE A DAY FOR 1 DAY AT FIRST SIGN OF COLD SORE 30 tablet 1   zolpidem (AMBIEN) 5 MG tablet Take 1 tablet (5 mg total) by mouth at bedtime as needed for sleep (do not drive for 8 hours after taking). (Patient not taking: Reported on 06/23/2021) 25 tablet 0   No current facility-administered medications for this visit.    No Known Allergies Session Note: He is only drinking a little bit of wine and no beer. Has lost weight and is exercising more. 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 We discussed a game plan to help him minimize alcohol intake. He is logging his alcohol intake and not drinking during the week. Curtis Hendrix has noticed how well he has been doing. Marriage is in a better place than before. He was previously insecure about their relationship.            Tx. Plan: Outpatient psychotherapy to develop strategies to reduce and manage alcohol consumption. Will also employ psychodynamic interventions to better understand patients tendency to  mask feelings with substances. Will address marital relationship issues with potential conjoint sessions. Goal date is 12-23.  Diagnoses:  Alcohol Abuse; Marital Discord, Generalized Anxiety  Plan of Care: Outpatient Psychotherapy   Marcelina Morel, PhD  2:10p-3:00p 50 minutes

## 2021-11-18 NOTE — Progress Notes (Deleted)
Initial neurology clinic note  SERVICE DATE: 11/20/21 SERVICE TIME: 8:00 am  Reason for Evaluation: Consultation requested by Marin Olp, MD for an opinion regarding left face, arm, and leg numbness and tingling***. My final recommendations will be communicated back to the requesting physician by way of shared medical record or letter to requesting physician via [place] mail.  HPI: This is Mr. EPHRAIM REICHEL, a 61 y.o. ***-handed male with a medical history of HTN, HLD, lumbar back surgery (left laminotomy at L4-5), chronic prostatitis, depression, OSA(CPAP?***), EtOH abuse*** who presents to neurology clinic with the chief complaint of ***. The patient is accompanied by ***.  ***  Patient was first seen by [facility] on 07/29/21. Patient had left side tingling for 3 days in the setting of elevated BP (170s/90s). Neurologic exam was unremarkable, as was EKG. At that time, the left arm and leg were more chronic (5 months) and the left face was new. Patient was then seen by PCP, Dr. Yong Channel, who referred patient to neurology.  The patient has not*** had similar episodes of symptoms in the past. *** Muscle bulk loss? *** Muscle pain? *** Cramps/Twitching? *** Suggestion of myotonia/difficulty relaxing after contraction? *** Fatigable weakness?*** Does strength improve after brief exercise?*** Able to brush hair/teeth without difficulty? *** Able to button shirts/use zips? *** Clumsiness/dropping grasped objects?*** Can you arise from squatted position easily? *** Able to get out of chair without using arms? *** Able to walk up steps easily? *** Use an assistive device to walk? *** Significant imbalance with walking? *** Falls?*** Any change in urine color, especially after exertion/physical activity? ***  The patient denies*** symptoms suggestive of oculobulbar weakness including diplopia, ptosis, dysphagia, poor saliva control, dysarthria/dysphonia, impaired mastication, facial  weakness/droop.  There are no*** neuromuscular respiratory weakness symptoms, particularly orthopnea>dyspnea. Pseudobulbar affect is absent***. The patient does not*** report symptoms referable to autonomic dysfunction including impaired sweating, heat or cold intolerance, excessive mucosal dryness, gastroparetic early satiety, postprandial abdominal bloating, constipation, bowel or bladder dyscontrol, erectile dysfunction*** or syncope/presyncope/orthostatic intolerance.  There are no*** complaints relating to other symptoms of small fiber modalities including paresthesia/pain.  The patient has not *** noticed any recent skin rashes nor does he*** report any constitutional symptoms like fever, night sweats, anorexia or unintentional weight loss.  EtOH use: ***  Restrictive diet? *** Family history of neuropathy/myopathy/NM disease?***  Previous labs, electrodiagnostics, and neuroimaging are summarized below, but pertinent findings include***  Any biopsy done? *** Current medications being tried for the patient's symptoms include ***  Prior medications that have been tried: ***   MEDICATIONS:  Outpatient Encounter Medications as of 11/20/2021  Medication Sig   buPROPion (WELLBUTRIN XL) 300 MG 24 hr tablet Take 1 tablet (300 mg total) by mouth daily.   hydrOXYzine (VISTARIL) 25 MG capsule Take 1 capsule (25 mg total) by mouth every 8 (eight) hours as needed (do not drive for 8 hours after taking).   lisinopril (ZESTRIL) 10 MG tablet Take 1 tablet (10 mg total) by mouth daily.   Multiple Vitamins-Minerals (ONE-A-DAY MENS HEALTH FORMULA) TABS Take by mouth.   predniSONE (DELTASONE) 20 MG tablet Take 2 pills for 3 days, 1 pill for 4 days   rosuvastatin (CRESTOR) 10 MG tablet Take 1 tablet (10 mg total) by mouth daily.   tamsulosin (FLOMAX) 0.4 MG CAPS capsule Take 1 capsule (0.4 mg total) by mouth daily.   valACYclovir (VALTREX) 1000 MG tablet TAKE 2 PILLS TWICE A DAY FOR 1 DAY AT  FIRST SIGN  OF COLD SORE   zolpidem (AMBIEN) 5 MG tablet Take 1 tablet (5 mg total) by mouth at bedtime as needed for sleep (do not drive for 8 hours after taking). (Patient not taking: Reported on 06/23/2021)   No facility-administered encounter medications on file as of 11/20/2021.    PAST MEDICAL HISTORY: Past Medical History:  Diagnosis Date   Arthritis    Fracture of rib of left side 04/2013   Hearing difficulty    left ear-since college. high frequency loss.    Hypertension    Personal history of colonic adenomas 02/23/2013   02/23/2013 6 small polyps removed, 5 recovered - all adenomas repeat colonoscopy 02/2016     Radiculopathy, lumbar region    Sleep apnea    mild-uses CPAP    PAST SURGICAL HISTORY: Past Surgical History:  Procedure Laterality Date   COLONOSCOPY  04/14/2016   [facility]   lumbar lamninectomy  2008   l4-l5 for drop foot   POLYPECTOMY     TONSILLECTOMY     TYMPANOSTOMY     tubes as child   WISDOM TOOTH EXTRACTION      ALLERGIES: No Known Allergies  FAMILY HISTORY: Family History  Problem Relation Age of Onset   Transient ischemic attack Mother        age 41   Bladder Cancer Father    Kidney cancer Father    Kidney cancer Brother        renal cell carcinoma age 59- blood in urine   Colon cancer Neg Hx    Esophageal cancer Neg Hx    Rectal cancer Neg Hx    Stomach cancer Neg Hx    Colon polyps Neg Hx     SOCIAL HISTORY: Social History   Tobacco Use   Smoking status: Never   Smokeless tobacco: Never  Vaping Use   Vaping Use: Never used  Substance Use Topics   Alcohol use: Yes    Alcohol/week: 6.0 standard drinks of alcohol    Types: 6 Cans of beer per week   Drug use: No   Social History   Social History Narrative   Married. 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  General:*** General appearance: Awake and alert. No distress. Cooperative with exam.  Skin: No obvious rash or jaundice. HEENT: Atraumatic. Anicteric. Lungs: Non-labored breathing on room air  Heart: Regular Abdomen: Soft, non tender. Extremities: No edema. No obvious deformity.  Musculoskeletal: No obvious joint swelling. Psych: Affect appropriate.  Neurological: Mental Status: Alert. Speech fluent. No pseudobulbar affect Cranial Nerves: CNII: No RAPD. Visual fields intact. CNIII, IV, VI: PERRL. No nystagmus. EOMI. CN V: Facial sensation intact bilaterally to fine touch. [place] clench strong. Jaw jerk***. CN VII: Facial muscles symmetric and strong. No ptosis at rest or after sustained upgaze***. CN VIII: Hears finger rub well bilaterally. CN IX: No hypophonia. CN X: Palate elevates symmetrically. CN XI: Full strength shoulder shrug bilaterally. CN XII: Tongue protrusion full and midline. No atrophy or fasciculations. No significant  dysarthria*** Motor: Tone is ***. *** fasciculations in *** extremities. *** atrophy. No grip or percussive myotonia.  Individual muscle group testing (MRC grade out of 5):  Movement     Neck flexion ***    Neck extension ***     Right Left   Shoulder abduction *** ***   Shoulder adduction *** ***   Shoulder ext rotation *** ***   Shoulder int rotation *** ***   Elbow flexion *** ***   Elbow extension *** ***   Wrist extension *** ***   Wrist flexion *** ***   Finger abduction - FDI *** ***   Finger abduction - ADM *** ***   Finger extension *** ***   Finger distal flexion - 2/3 *** ***   Finger distal flexion - 4/5 *** ***   Thumb flexion - FPL *** ***   Thumb abduction - APB ***  ***    Hip flexion *** ***   Hip extension *** ***   Hip adduction *** ***   Hip abduction *** ***   Knee extension *** ***   Knee flexion *** ***   Dorsiflexion *** ***   Plantarflexion *** ***   Inversion *** ***   Eversion *** ***   Great toe extension *** ***   Great toe flexion *** ***     Reflexes:  Right Left   Bicep *** ***   Tricep *** ***   BrRad *** ***   Knee *** ***   Ankle *** ***    Pathological Reflexes: Babinski: *** response bilaterally*** Hoffman: *** Troemner: *** Pectoral: *** Palmomental: *** Facial: *** Midline tap: *** Sensation: Pinprick: *** Vibration: *** Temperature: *** Proprioception: *** Coordination: Intact finger-to- nose-finger bilaterally. Romberg negative.*** Gait: Able to rise from chair with arms crossed unassisted. Normal, narrow-based gait. Able to tandem walk. Able to walk on toes and heels.***  Lab and Test Review: Internal labs: *** 08/04/21: Normal or unremarkable: Lipid panel, TSH, CBC Mildly elevated glucose on CMP B12: 370  External labs: ***  Cervical spine xrays (08/05/21): FINDINGS: Straightening of the normal cervical lordosis. Visualization through T1 on lateral view. Multilevel degenerative disc disease with disc space narrowing and anterior endplate osteophytosis. Preservation of the vertebral body heights. Bilateral multilevel osseous neural foraminal narrowing most pronounced left C3-4 and C4-5 as well as right C3-4. Lateral masses articulate appropriately with the dens. Lung apices are clear. Carotid vascular calcifications.   IMPRESSION: Multilevel degenerative disc and facet disease with osseous neural foraminal narrowing most pronounced C3-4.   Bilateral carotid vascular calcifications.  MRI brain wo contrast (01/06/15): FINDINGS: There is no evidence of acute infarct, intracranial hemorrhage, midline shift, or extra-axial fluid collection. There is very mild cerebral and cerebellar  atrophy. Punctate foci of T2 hyperintensity in the subcortical white matter of the frontal lobes do not appear significantly changed, are at most minimally prominent for age, and are nonspecific.   Lobulated cystic lesion in the right temporal lobe with a thin internal septation follows CSF signal intensity on all sequences and is unchanged in size, measuring 10 x 5 x 14 mm. There is no surrounding gliosis, and no abnormal enhancement was present on the prior study.   Orbits are unremarkable. Paranasal sinuses and mastoid air cells are clear. Major intracranial vascular flow voids are preserved.   IMPRESSION: 1. No acute or new intracranial abnormality. 2. Unchanged 14 mm benign-appearing cystic lesion in the right temporal lobe.  MRI lumbar spine w/wo contrast (04/06/08): Findings: Again seen is  a small hemangioma in L3.  Vertebral body  signal is otherwise unremarkable.  Vertebral body height and  alignment are normal.  The conus medullaris is normal in signal and  position.  Imaged intra-abdominal contents are unremarkable.  The  T12-L1, L1-2 and L2-3 levels are negative.     L3-4:  There is an annular tear and shallow central protrusion.  Mild facet degenerative disease is noted.  Central spinal canal and  neural foramina remain open.  Appearance of this level is  unchanged.     L4-5:  Left laminotomy defect is noted.  There is some epidural  enhancement about the thecal sac on the left.  The previously  identified extruded disc fragment in the left lateral recess has  been removed.  Annular tear and shallow central protrusion do not  appear markedly changed.  Central canal is only mildly narrowed at  this level.  Foramina are open.  Facet degenerative change noted.     L5-S1:  Facet arthropathy is again seen.  There is annular tear  shallow central disc protrusion but the central spinal canal and  neural foramina remain open.     IMPRESSION:  1.  Interval left  laminotomy at L4-5 for resection of an extruded  disc fragment.  Appearance of this level is markedly improved.  Annular tear and shallow central protrusion persist but the central  canal is only mildly narrowed.  Facet arthropathy noted.  2.  Unchanged shallow central protrusion at L3-4 without central  canal stenosis.  3.  Tiny central protrusion L5-S1 without central canal or  foraminal narrowing.  ***  ASSESSMENT: KAVI ALMQUIST is a 61 y.o. male who presents for evaluation of ***. *** has a relevant medical history of ***. *** neurological examination is pertinent for ***. Available diagnostic data is significant for ***. This constellation of symptoms and objective data would most likely localize to ***. ***  PLAN: -Blood work: *** ***  -Return to clinic ***  The impression above as well as the plan as outlined below were extensively discussed with the patient (in the company of ***) who voiced understanding. All questions were answered to their satisfaction.  The patient was counseled on pertinent fall precautions per the printed material provided today ***, and as noted under the "Patient Instructions" section below.  When available, results of the above investigations and possible further recommendations will be communicated to the patient via telephone/MyChart. Patient to call office if not contacted after expected testing turnaround time.   Total time spent reviewing records, interview, history/exam, documentation, and coordination of care on day of encounter:  *** min   Thank you for allowing me to participate in patient's care.  If I can answer any additional questions, I would be pleased to do so.  Kai Levins, MD   CC: Yong Channel [address], [place] [id number]  CC: Referring provider: Marin Olp, [address] [place],  [place] [id number]

## 2021-11-20 ENCOUNTER — Ambulatory Visit: Payer: Commercial Managed Care - PPO | Admitting: Neurology

## 2021-11-20 ENCOUNTER — Encounter: Payer: Self-pay | Admitting: Neurology

## 2021-11-20 DIAGNOSIS — Z029 Encounter for administrative examinations, unspecified: Secondary | ICD-10-CM

## 2021-12-08 ENCOUNTER — Encounter: Payer: Self-pay | Admitting: *Deleted

## 2021-12-10 ENCOUNTER — Encounter: Payer: Self-pay | Admitting: Family Medicine

## 2021-12-16 MED ORDER — ZOLPIDEM TARTRATE 5 MG PO TABS: 5.0000 mg | ORAL_TABLET | Freq: Every evening | ORAL | 0 refills | Status: DC | PRN

## 2021-12-24 ENCOUNTER — Encounter: Payer: Self-pay | Admitting: Family Medicine

## 2021-12-25 NOTE — Telephone Encounter (Signed)
PCP schedule booked until Monday, October 16th. Would it be okay to work in on 10/13 if able? If not can visit be with another provider?   Please advise.

## 2021-12-26 ENCOUNTER — Encounter: Payer: Commercial Managed Care - PPO | Admitting: Family Medicine

## 2022-02-26 ENCOUNTER — Encounter: Payer: Self-pay | Admitting: *Deleted

## 2022-04-07 ENCOUNTER — Ambulatory Visit (INDEPENDENT_AMBULATORY_CARE_PROVIDER_SITE_OTHER): Payer: Commercial Managed Care - PPO | Admitting: Family Medicine

## 2022-04-07 ENCOUNTER — Other Ambulatory Visit: Payer: Self-pay

## 2022-04-07 ENCOUNTER — Encounter: Payer: Self-pay | Admitting: Family Medicine

## 2022-04-07 VITALS — BP 132/78 | HR 66 | Temp 97.8°F | Ht 74.0 in | Wt 214.8 lb

## 2022-04-07 DIAGNOSIS — I10 Essential (primary) hypertension: Secondary | ICD-10-CM

## 2022-04-07 DIAGNOSIS — Z0000 Encounter for general adult medical examination without abnormal findings: Secondary | ICD-10-CM

## 2022-04-07 DIAGNOSIS — E785 Hyperlipidemia, unspecified: Secondary | ICD-10-CM

## 2022-04-07 DIAGNOSIS — I771 Stricture of artery: Secondary | ICD-10-CM | POA: Diagnosis not present

## 2022-04-07 DIAGNOSIS — Z125 Encounter for screening for malignant neoplasm of prostate: Secondary | ICD-10-CM

## 2022-04-07 DIAGNOSIS — R739 Hyperglycemia, unspecified: Secondary | ICD-10-CM

## 2022-04-07 LAB — COMPREHENSIVE METABOLIC PANEL
ALT: 32 U/L (ref 0–53)
AST: 31 U/L (ref 0–37)
Albumin: 4.6 g/dL (ref 3.5–5.2)
Alkaline Phosphatase: 47 U/L (ref 39–117)
BUN: 16 mg/dL (ref 6–23)
CO2: 28 mEq/L (ref 19–32)
Calcium: 9.6 mg/dL (ref 8.4–10.5)
Chloride: 99 mEq/L (ref 96–112)
Creatinine, Ser: 0.94 mg/dL (ref 0.40–1.50)
GFR: 88.44 mL/min (ref >60.00)
Glucose, Bld: 109 mg/dL — ABNORMAL HIGH (ref 70–99)
Potassium: 3.9 mEq/L (ref 3.5–5.1)
Sodium: 135 mEq/L (ref 135–145)
Total Bilirubin: 1.1 mg/dL (ref 0.2–1.2)
Total Protein: 7.1 g/dL (ref 6.0–8.3)

## 2022-04-07 LAB — CBC WITH DIFFERENTIAL/PLATELET
Basophils Absolute: 0 10*3/uL (ref 0.0–0.1)
Basophils Relative: 1.1 % (ref 0.0–3.0)
Eosinophils Absolute: 0.1 10*3/uL (ref 0.0–0.7)
Eosinophils Relative: 1.5 % (ref 0.0–5.0)
HCT: 42.4 % (ref 39.0–52.0)
Hemoglobin: 14.4 g/dL (ref 13.0–17.0)
Lymphocytes Relative: 23.8 % (ref 12.0–46.0)
Lymphs Abs: 1 10*3/uL (ref 0.7–4.0)
MCHC: 33.9 g/dL (ref 30.0–36.0)
MCV: 97.3 fl (ref 78.0–100.0)
Monocytes Absolute: 0.5 10*3/uL (ref 0.1–1.0)
Monocytes Relative: 11.4 % (ref 3.0–12.0)
Neutro Abs: 2.7 10*3/uL (ref 1.4–7.7)
Neutrophils Relative %: 62.2 % (ref 43.0–77.0)
Platelets: 241 10*3/uL (ref 150.0–400.0)
RBC: 4.36 Mil/uL (ref 4.22–5.81)
RDW: 13.6 % (ref 11.5–15.5)
WBC: 4.3 10*3/uL (ref 4.0–10.5)

## 2022-04-07 LAB — POCT URINALYSIS DIPSTICK
Bilirubin, UA: NEGATIVE
Blood, UA: NEGATIVE
Glucose, UA: NEGATIVE
Ketones, UA: NEGATIVE
Leukocytes, UA: NEGATIVE
Nitrite, UA: NEGATIVE
Protein, UA: NEGATIVE
Spec Grav, UA: 1.01 (ref 1.010–1.025)
Urobilinogen, UA: 0.2 E.U./dL
pH, UA: 7 (ref 5.0–8.0)

## 2022-04-07 LAB — TSH: TSH: 1.82 u[IU]/mL (ref 0.35–5.50)

## 2022-04-07 LAB — LIPID PANEL
Cholesterol: 188 mg/dL (ref 0–200)
HDL: 82.7 mg/dL (ref >39.00)
LDL Cholesterol: 81 mg/dL (ref 0–99)
NonHDL: 105.67
Total CHOL/HDL Ratio: 2
Triglycerides: 124 mg/dL (ref 0.0–149.0)
VLDL: 24.8 mg/dL (ref 0.0–40.0)

## 2022-04-07 LAB — HEMOGLOBIN A1C: Hgb A1c MFr Bld: 5.2 % (ref 4.6–6.5)

## 2022-04-07 LAB — PSA: PSA: 0.66 ng/mL (ref 0.10–4.00)

## 2022-04-07 MED ORDER — ROSUVASTATIN CALCIUM 20 MG PO TABS: 20.0000 mg | ORAL_TABLET | Freq: Every day | ORAL | 3 refills | Status: DC

## 2022-04-07 MED ORDER — BUPROPION HCL ER (XL) 150 MG PO TB24: 150.0000 mg | ORAL_TABLET | Freq: Every day | ORAL | 3 refills | Status: DC

## 2022-04-07 MED ORDER — VALACYCLOVIR HCL 1 G PO TABS: ORAL_TABLET | ORAL | 1 refills | Status: DC

## 2022-04-07 MED ORDER — ZOLPIDEM TARTRATE 5 MG PO TABS: 5.0000 mg | ORAL_TABLET | Freq: Every evening | ORAL | 0 refills | Status: DC | PRN

## 2022-04-07 NOTE — Patient Instructions (Addendum)
depression in full remission- want sto trial 150 mg wellbutrin- I tihnk now is a reasonable time- sent this in for him- he will let me know if has worsening symptoms and we can go back up if needed   We will call you within two weeks about your referral to vascular surgery. If you do not hear within 2 weeks, give [place] a call.   For my team- do NOT release lipid panel and cmp- only release PSA and a1c  and have them ad that to labs  Increase rosuvastatin to 20 mg  Recommended follow up: Return in about 4 months (around 08/06/2022) for followup or sooner if needed.Schedule b4 you leave. -schedule lab visit a day or two ahead of visit and then we can discuss results

## 2022-04-07 NOTE — Progress Notes (Signed)
Phone: [phone]   Subjective:  Patient presents today for their annual physical. Chief complaint-noted.   See problem oriented charting- ROS- full  review of systems was completed and negative  except for: working on stye in eye with optho  The following were reviewed and entered/updated in epic: Past Medical History:  Diagnosis Date   Arthritis    Fracture of rib of left side 04/2013   Hearing difficulty    left ear-since college. high frequency loss.    Hypertension    Personal history of colonic adenomas 02/23/2013   02/23/2013 6 small polyps removed, 5 recovered - all adenomas repeat colonoscopy 02/2016     Radiculopathy, lumbar region    Sleep apnea    mild-uses CPAP   Patient Active Problem List   Diagnosis Date Noted   BPH associated with nocturia 06/25/2015    Priority: Medium    OSA (obstructive sleep apnea) 02/15/2014    Priority: Medium    Depression 11/07/2013    Priority: Medium    Oral herpes simplex infection 11/07/2013    Priority: Medium    Hyperlipidemia 06/Curtis/2009    Priority: Medium    Essential hypertension 06/10/2007    Priority: Medium    Insomnia 12/18/2013    Priority: Low   History of colonic polyps 02/23/2013    Priority: Low   Chronic prostatitis 06/Curtis/2009    Priority: Low   CHEST PAIN UNSPECIFIED 07/19/2007    Priority: [place] maintenance 01/10/2020   Past Surgical History:  Procedure Laterality Date   COLONOSCOPY  04/14/2016   [facility]   lumbar lamninectomy  2008   l4-l5 for drop foot   POLYPECTOMY     TONSILLECTOMY     TYMPANOSTOMY     tubes as child   WISDOM TOOTH EXTRACTION      Family History  Problem Relation Age of Onset   Transient ischemic attack Mother        age 33   Bladder Cancer Father    Kidney cancer Father    Kidney cancer Brother        renal cell carcinoma age 54- blood in urine   Colon cancer Neg Hx    Esophageal cancer Neg Hx    Rectal cancer Neg Hx    Stomach cancer Neg Hx     Colon polyps Neg Hx     Medications- reviewed and updated Current Outpatient Medications  Medication Sig Dispense Refill   buPROPion (WELLBUTRIN XL) 150 MG 24 hr tablet Take 1 tablet (150 mg total) by mouth daily. 90 tablet 3   lisinopril (ZESTRIL) 10 MG tablet Take 1 tablet (10 mg total) by mouth daily. 90 tablet 3   Multiple Vitamins-Minerals (ONE-A-DAY MENS HEALTH FORMULA) TABS Take by mouth.     rosuvastatin (CRESTOR) 20 MG tablet Take 1 tablet (20 mg total) by mouth daily. 90 tablet 3   valACYclovir (VALTREX) 1000 MG tablet TAKE 2 PILLS TWICE A DAY FOR 1 DAY AT FIRST SIGN OF COLD SORE 30 tablet 1   zolpidem (AMBIEN) 5 MG tablet Take 1 tablet (5 mg total) by mouth at bedtime as needed for sleep (do not drive for 8 hours after taking). 25 tablet 0   No current facility-administered medications for this visit.    Allergies-reviewed and updated No Known Allergies  Social History   Social History Narrative   Married. 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 Oropharynx normal Neck: no thyromegaly CV: RRR no murmurs rubs or gallops Lungs: CTAB no crackles, wheeze, rhonchi Abdomen: soft/nontender/nondistended/normal bowel sounds. No rebound or guarding.  Ext: no edema Skin: warm, dry Neuro: grossly normal, moves all extremities, PERRLA    Assessment and Plan  62 y.o. Hendrix presenting for annual physical.  Health Maintenance counseling: 1. Anticipatory guidance: Patient  counseled regarding regular dental exams -q4 months, eye exams - just had exam,  avoiding smoking and second hand smoke , limiting alcohol to 2 beverages per day - 8 a week, no illicit drugs .   2. Risk factor reduction:  Advised patient of need for regular exercise and diet rich and fruits and vegetables to reduce risk of heart attack and stroke.  Exercise- still doing spin class and boot camp.  Diet/weight management-reasonably healthy weight- up just slightly over holidays but feels can improve diet- working on it now [facility] Readings from Last 3 Encounters:  04/07/22 214 lb 12.8 oz (97.4 kg)  08/05/21 211 lb (95.7 kg)  08/04/21 211 lb (95.7 kg)  3. Immunizations/screenings/ancillary studies- opts out of covid shot Immunization History  Administered Date(s) Administered   Influenza Inj Mdck Quad Pf 03/07/2022   Influenza Split 12/28/2011, 12/16/2018   Influenza Whole 03/17/2003   Influenza,inj,Quad PF,6+ Mos 02/15/2014, 12/04/2014, 02/18/2016, 02/01/2018, 12/11/2019   Influenza-Unspecified 02/15/2014, 12/04/2014, 02/18/2016, 02/01/2018, 12/11/2019   Moderna Covid-19 Vaccine Bivalent Booster 30yr & up 01/28/2021   Moderna Sars-Covid-2 Vaccination 01/10/2020   PFIZER(Purple Top)SARS-COV-2 Vaccination 06/02/2019, 06/27/2019   Td 03/17/1999, 06/29/2008   Tdap 10/25/2018   Zoster Recombinat (Shingrix) 07/13/2016, 09/22/2016   Zoster, Live 06/25/2015  4. Prostate cancer screening- low risk prior psa trend- update with labs- was told this could be added on . Did have 7 months ago with [facility] and 0.51 so low risk Lab Results  Component Value Date   PSA 0.48 12/07/2019   PSA 0.43 10/25/2018   PSA 0.60 07/06/2016   5. Colon cancer screening - History of adenomatous colon polyps February 13, 2020 with 3-year repeat due to multiple polyps- will be due later this year 6. Skin cancer screening- still seeing dermatology Dr. DWilhemina Bonito advised regular sunscreen use. Denies worrisome, changing, or new skin  lesions.  7. Smoking associated screening (lung cancer screening, AAA screen 65-75, UA)- never smoker 8. STD screening - only active with wife  Status of chronic or acute concerns   #hypertension S: medication: Lisinopril 10 mg.  Appears [facility] added lisinopril hydrochlorothiazide- he states not taking this Home readings #s: 130/80 BP Readings from Last 3 Encounters:  04/07/22 132/78  08/04/21 (!) 144/70  06/23/21 (!) 150/88  A/P: reasonable control - could be more aggressive for <130/80- he is going to try to minimize salt and continue exercise  #hyperlipidemia Carotid artery duplex-bilateral carotid artery calcifications 08/06/2021 cervical spine-ultimately with carotid ultrasound 6/Curtis/2023 which showed only 1 to 39% stenosis but stenosis of right subclavian  artery-referred to vascular surgery but they were unable to reach patient S: Medication:Rosuvastatin 10 mg, aspirin 81 mg Lab Results  Component Value Date   CHOL 188 04/07/2022   HDL 82.70 04/07/2022   LDLCALC 81 04/07/2022   LDLDIRECT 115.6 01/02/2014   TRIG 124.0 04/07/2022   CHOLHDL 2 04/07/2022   A/P: with very mild carotid stenosis and subclavian stenosis noted- will increase rosuvastatin to 20 mg and recheck next visit -stay on aspirin with subclavian stenosis unless vascular recommends against (referring for subclavian stenosis)   # Depression S: Medication:Wellbutrin 300 mg extended release,  ambien 5 mg for sleep with travel    04/07/2022    1:22 PM 08/04/2021   11:29 AM 04/24/2021   11:17 AM  Depression screen PHQ 2/9  Decreased Interest 0 0 0  Down, Depressed, Hopeless 0 0 0  PHQ - 2 Score 0 0 0  Altered sleeping 0 3 0  Tired, decreased energy 0 0 0  Change in appetite 0 0 0  Feeling bad or failure about yourself  0 0 0  Trouble concentrating 0 0 0  Moving slowly or fidgety/restless 0 0 0  Suicidal thoughts 0 0 0  PHQ-9 Score 0 3 0  Difficult doing work/chores Not difficult at all Not difficult at all Not  difficult at all  A/P: depression in full remission- want sto trial 150 mg wellbutrin- I tihnk now is a reasonable time- sent this in for him- he will let me know if has worsening symptoms and we can go back up if needed    # BPH S: Medication: took himself off tamsulosin 0.4 mg- has not noted big difference. Still with nocturia but didn't change on medicine A/P: tolerating symptoms- drinks fluids late and advised against    # Left hand numbness and left facial numbness 01/21/2022-seen at [facility] last 01/21/2022 with plan for MRI and MRI of brain. Does not appear to be related to subclavian stenosis on the right- and perhaps mild turbulent flow on left. No clear cause was found- ? If anxiety related. No recurrence. They did not think MS related.  #Cold sores- valtrex as needed - refill today   Recommended follow up: Return in about 4 months (around 08/06/2022) for followup or sooner if needed.Schedule b4 you leave.  Lab/Order associations:labs were fasting earlier today   ICD-10-CM   1. Preventative health care  Z00.00 TSH    Lipid panel    Comprehensive metabolic panel    CBC with Differential/Platelet    POCT Urinalysis Dipstick    2. Hyperlipidemia, unspecified hyperlipidemia type  E78.5 Lipid panel    Lipid panel    Comprehensive metabolic panel    3. Essential hypertension  I10 Comprehensive metabolic panel    CBC with Differential/Platelet    4. Subclavian artery stenosis (HCC)  I77.1 Ambulatory referral to Vascular Surgery    5. Screening for prostate cancer  Z12.5 PSA      Meds ordered this encounter  Medications   valACYclovir (VALTREX) 1000 MG tablet    Sig: TAKE 2 PILLS TWICE A DAY FOR 1 DAY AT FIRST SIGN OF COLD SORE    Dispense:  30 tablet    Refill:  1   zolpidem (AMBIEN) 5 MG tablet    Sig: Take 1 tablet (5 mg total) by mouth at bedtime as needed for sleep (do not drive for 8 hours after taking).    Dispense:  25 tablet    Refill:  0   buPROPion (  WELLBUTRIN XL) 150  MG 24 hr tablet    Sig: Take 1 tablet (150 mg total) by mouth daily.    Dispense:  90 tablet    Refill:  3   rosuvastatin (CRESTOR) 20 MG tablet    Sig: Take 1 tablet (20 mg total) by mouth daily.    Dispense:  90 tablet    Refill:  3    Return precautions advised.  Garret Reddish, MD

## 2022-04-08 ENCOUNTER — Other Ambulatory Visit: Payer: Self-pay | Admitting: *Deleted

## 2022-04-08 DIAGNOSIS — I6529 Occlusion and stenosis of unspecified carotid artery: Secondary | ICD-10-CM

## 2022-04-10 ENCOUNTER — Encounter: Payer: Self-pay | Admitting: Family Medicine

## 2022-04-20 ENCOUNTER — Encounter: Payer: Commercial Managed Care - PPO | Admitting: Surgery

## 2022-04-20 ENCOUNTER — Ambulatory Visit (HOSPITAL_COMMUNITY): Payer: Commercial Managed Care - PPO

## 2022-04-29 ENCOUNTER — Ambulatory Visit (INDEPENDENT_AMBULATORY_CARE_PROVIDER_SITE_OTHER): Payer: Commercial Managed Care - PPO | Admitting: Vascular Surgery

## 2022-04-29 ENCOUNTER — Ambulatory Visit (HOSPITAL_COMMUNITY)
Admission: RE | Admit: 2022-04-29 | Discharge: 2022-04-29 | Disposition: A | Discharge status: Home / Self Care | Payer: Commercial Managed Care - PPO | Source: Ambulatory Visit | Attending: Surgery | Admitting: Surgery

## 2022-04-29 ENCOUNTER — Encounter: Payer: Self-pay | Admitting: Vascular Surgery

## 2022-04-29 VITALS — BP 134/82 | HR 63 | Temp 98.3°F | Resp 20 | Ht 74.0 in | Wt 213.0 lb

## 2022-04-29 DIAGNOSIS — I6529 Occlusion and stenosis of unspecified carotid artery: Secondary | ICD-10-CM

## 2022-04-29 DIAGNOSIS — I771 Stricture of artery: Secondary | ICD-10-CM

## 2022-04-29 NOTE — Progress Notes (Signed)
Patient ID: LAQUANE DELAUGHTER, male   DOB: Mar 13, 1961, 62 y.o.   MRN: [id number]  Reason for Consult: New Patient (Initial Visit)   Referred by Marin Olp, MD  Subjective:     HPI:  DOMINIE HALLUM is a 62 y.o. male was traveling back from the beach when he had left upper extremity symptoms concerning for stroke he was evaluated at [facility] and stroke test were negative.  He was found to have subclavian artery stenosis.  Symptoms have not recurred.  He is able to use both arms without any limitations.  Recently traveled to [facility] for a concert did not have any symptoms.  He is a lifelong non-smoker his father did have a stroke but there is no other history of vascular disease in the family.  He is not taking aspirin and a statin.  Past Medical History:  Diagnosis Date   Arthritis    Fracture of rib of left side 04/2013   Hearing difficulty    left ear-since college. high frequency loss.    Hypertension    Personal history of colonic adenomas 02/23/2013   02/23/2013 6 small polyps removed, 5 recovered - all adenomas repeat colonoscopy 02/2016     Radiculopathy, lumbar region    Sleep apnea    mild-uses CPAP   Family History  Problem Relation Age of Onset   Transient ischemic attack Mother        age 37   Bladder Cancer Father    Kidney cancer Father    Kidney cancer Brother        renal cell carcinoma age 29- blood in urine   Colon cancer Neg Hx    Esophageal cancer Neg Hx    Rectal cancer Neg Hx    Stomach cancer Neg Hx    Colon polyps Neg Hx    Past Surgical History:  Procedure Laterality Date   COLONOSCOPY  04/14/2016   [facility]   lumbar lamninectomy  2008   l4-l5 for drop foot   POLYPECTOMY     TONSILLECTOMY     TYMPANOSTOMY     tubes as child   WISDOM TOOTH EXTRACTION      Short Social History:  Social History   Tobacco Use   Smoking status: Never   Smokeless tobacco: Never  Substance Use Topics   Alcohol use: Yes    Alcohol/week: 6.0 standard drinks of  alcohol    Types: 6 Cans of beer per week    No Known Allergies  Current Outpatient Medications  Medication Sig Dispense Refill   atovaquone-proguanil (MALARONE) 250-100 MG TABS tablet Take 1 tablet by mouth daily.     buPROPion (WELLBUTRIN XL) 150 MG 24 hr tablet Take 1 tablet (150 mg total) by mouth daily. 90 tablet 3   lisinopril (ZESTRIL) 10 MG tablet Take 1 tablet (10 mg total) by mouth daily. 90 tablet 3   Multiple Vitamins-Minerals (ONE-A-DAY MENS HEALTH FORMULA) TABS Take by mouth.     rosuvastatin (CRESTOR) 20 MG tablet Take 1 tablet (20 mg total) by mouth daily. 90 tablet 3   No current facility-administered medications for this visit.    Review of Systems  Constitutional:  Constitutional negative. HENT: HENT negative.  Eyes: Eyes negative.  Respiratory: Respiratory negative.  Cardiovascular: Cardiovascular negative.  GI: Gastrointestinal negative.  Musculoskeletal: Musculoskeletal negative.  Skin: Skin negative.  Neurological: Positive for dizziness, focal weakness and numbness.  Hematologic: Hematologic/lymphatic negative.  Psychiatric: Psychiatric negative.  Objective:  Objective   Vitals:   04/29/22 1356 04/29/22 1358  BP: 127/79 134/82  Pulse: 63   Resp: 20   Temp: 98.3 F (36.8 C)   SpO2: 96%   Weight: 213 lb (96.6 kg)   Height: 6' 2"$  (1.88 m)    Body mass index is 27.35 kg/m.  Physical Exam HENT:     Head: Normocephalic.     Nose: Nose normal.  Eyes:     Pupils: Pupils are equal, round, and reactive to light.  Cardiovascular:     Rate and Rhythm: Normal rate.     Pulses: Normal pulses.          Radial pulses are 2+ on the right side and 2+ on the left side.       Posterior tibial pulses are 2+ on the right side and 2+ on the left side.  Pulmonary:     Effort: Pulmonary effort is normal.  Abdominal:     General: Abdomen is flat.  Musculoskeletal:        General: Normal range of motion.     Cervical back: Normal range of motion.      Right lower leg: No edema.     Left lower leg: No edema.     Comments: Left thigh with reticular veins posterior thigh  Skin:    General: Skin is warm.     Capillary Refill: Capillary refill takes less than 2 seconds.  Neurological:     General: No focal deficit present.     Mental Status: He is alert.  Psychiatric:        Mood and Affect: Mood normal.        Thought Content: Thought content normal.        Judgment: Judgment normal.     Data: Right Carotid Findings:  +----------+--------+--------+--------+------------------+--------+           PSV cm/sEDV cm/sStenosisPlaque DescriptionComments  +----------+--------+--------+--------+------------------+--------+  CCA Prox  106     17                                          +----------+--------+--------+--------+------------------+--------+  CCA Mid   91      19                                          +----------+--------+--------+--------+------------------+--------+  CCA Distal71      24                                          +----------+--------+--------+--------+------------------+--------+  ICA Prox  65      22      1-39%   heterogenous                +----------+--------+--------+--------+------------------+--------+  ICA Mid   73      30              homogeneous                 +----------+--------+--------+--------+------------------+--------+  ICA Distal66      22                                          +----------+--------+--------+--------+------------------+--------+  ECA      54      13                                          +----------+--------+--------+--------+------------------+--------+   +----------+--------+-------+--------+-------------------+           PSV cm/sEDV cmsDescribeArm Pressure (mmHG)  +----------+--------+-------+--------+-------------------+  Subclavian286    14     Stenotic117                   +----------+--------+-------+--------+-------------------+   +---------+--------+--+--------+--+---------+  VertebralPSV cm/s55EDV cm/s18Antegrade  +---------+--------+--+--------+--+---------+      Left Carotid Findings:  +----------+--------+--------+--------+----------------------------+-------  -+           PSV cm/sEDV cm/sStenosisPlaque Description           Comments  +----------+--------+--------+--------+----------------------------+-------  -+  CCA Prox  115     29                                                     +----------+--------+--------+--------+----------------------------+-------  -+  CCA Mid   87      32                                                     +----------+--------+--------+--------+----------------------------+-------  -+  CCA Distal87      19              heterogenous and homogeneous           +----------+--------+--------+--------+----------------------------+-------  -+  ICA Prox  66      18      1-39%   homogeneous                            +----------+--------+--------+--------+----------------------------+-------  -+  ICA Mid   59      26                                                     +----------+--------+--------+--------+----------------------------+-------  -+  ICA Distal61      27                                                     +----------+--------+--------+--------+----------------------------+-------  -+  ECA      101     21                                                     +----------+--------+--------+--------+----------------------------+-------  -+   +----------+--------+--------+--------+-------------------+           PSV cm/sEDV cm/sDescribeArm Pressure (mmHG)  +----------+--------+--------+--------+-------------------+  [id number]    29  Stenotic125                  +----------+--------+--------+--------+-------------------+    +---------+--------+--+--------+--+---------+  VertebralPSV cm/s48EDV cm/s19Antegrade  +---------+--------+--+--------+--+---------+         Summary:  Right Carotid: Velocities in the right ICA are consistent with a 1-39%  stenosis.   Left Carotid: Velocities in the left ICA are consistent with a 1-39%  stenosis.   Vertebrals: Bilateral vertebral arteries demonstrate antegrade flow.  Subclavians: Bilateral subclavian arteries were stenotic.       Assessment/Plan:    62 year old male with bilateral subclavian artery stenosis that is asymptomatic with palpable radial pulses bilaterally.  He is on aspirin and statin.  We discussed the signs and symptoms of stroke he demonstrates good understanding.  Short of this I will see him back in 2 years with repeat carotid duplex.     Waynetta Sandy MD [facility]

## 2022-06-15 ENCOUNTER — Other Ambulatory Visit: Payer: Self-pay | Admitting: Family Medicine

## 2022-06-23 ENCOUNTER — Encounter: Payer: Self-pay | Admitting: Family Medicine

## 2022-06-23 MED ORDER — DOXYCYCLINE HYCLATE 100 MG PO TABS: 200.0000 mg | ORAL_TABLET | Freq: Once | ORAL | 0 refills | Status: AC

## 2022-07-28 ENCOUNTER — Other Ambulatory Visit: Payer: Self-pay

## 2022-07-28 ENCOUNTER — Encounter: Payer: Self-pay | Admitting: Family Medicine

## 2022-07-28 DIAGNOSIS — M25572 Pain in left ankle and joints of left foot: Secondary | ICD-10-CM

## 2022-08-19 ENCOUNTER — Other Ambulatory Visit: Payer: Self-pay | Admitting: Podiatry

## 2022-08-19 ENCOUNTER — Ambulatory Visit (INDEPENDENT_AMBULATORY_CARE_PROVIDER_SITE_OTHER): Payer: Commercial Managed Care - PPO | Admitting: Podiatry

## 2022-08-19 ENCOUNTER — Ambulatory Visit (INDEPENDENT_AMBULATORY_CARE_PROVIDER_SITE_OTHER): Payer: Commercial Managed Care - PPO

## 2022-08-19 ENCOUNTER — Encounter: Payer: Self-pay | Admitting: Podiatry

## 2022-08-19 DIAGNOSIS — M6740 Ganglion, unspecified site: Secondary | ICD-10-CM

## 2022-08-19 DIAGNOSIS — M79672 Pain in left foot: Secondary | ICD-10-CM

## 2022-08-19 NOTE — Progress Notes (Signed)
Subjective:   Patient ID: Curtis Hendrix, male   DOB: 62 y.o.   MRN: [id number]   HPI Patient states he is developed a mass around his left big toe over the last few months and it gets tender at times and makes it hard to wear shoe gear.  Patient does not remember specific injury does not smoke likes to be active   Review of Systems  All other systems reviewed and are negative.       Objective:  Physical Exam Vitals and nursing note reviewed.  Constitutional:      Appearance: He is well-developed.  Pulmonary:     Effort: Pulmonary effort is normal.  Musculoskeletal:        General: Normal range of motion.  Skin:    General: Skin is warm.  Neurological:     Mental Status: He is alert.     Neurovascular status intact muscle strength adequate range of motion adequate with a left lesion measuring about 8 x 8 mm freely movable within subcutaneous tissue at the joint lateral side first MPJ.  Good digital perfusion well-oriented     Assessment:  Probability for cyst or other soft tissue tumor left cannot rule out bony pathology     Plan:  H&P x-rays reviewed and went ahead today did a forefoot block left I then aspirated this getting out around three-quarter cc of gelatinous fluid consistent with ganglion on I then compressed the area explaining what to do if it were to recur and reviewed condition with patient  X-rays indicate very small spur but no calcification with obvious soft tissue lesion present on x-ray

## 2022-09-03 ENCOUNTER — Encounter: Payer: Self-pay | Admitting: Family Medicine

## 2022-09-03 MED ORDER — ZOLPIDEM TARTRATE 5 MG PO TABS: 5.0000 mg | ORAL_TABLET | Freq: Every evening | ORAL | 0 refills | Status: DC | PRN

## 2022-10-14 ENCOUNTER — Encounter: Payer: Self-pay | Admitting: Podiatry

## 2022-10-14 ENCOUNTER — Ambulatory Visit (INDEPENDENT_AMBULATORY_CARE_PROVIDER_SITE_OTHER): Payer: Commercial Managed Care - PPO | Admitting: Podiatry

## 2022-10-14 DIAGNOSIS — L600 Ingrowing nail: Secondary | ICD-10-CM

## 2022-10-14 DIAGNOSIS — M2042 Other hammer toe(s) (acquired), left foot: Secondary | ICD-10-CM

## 2022-10-14 DIAGNOSIS — M6740 Ganglion, unspecified site: Secondary | ICD-10-CM | POA: Diagnosis not present

## 2022-10-14 NOTE — Progress Notes (Signed)
Subjective:   Patient ID: Curtis Hendrix, male   DOB: 62 y.o.   MRN: [id number]   HPI Patient presents stating he has had a reoccurrence of the mass on the lateral side of the left first MPJ and also has significant discomfort pain of the second nailbed left mild on the third with hammertoe deformity stating the nail is becoming increasingly difficult to deal with   ROS      Objective:  Physical Exam  Neuro vas scaler status intact mass of the left lateral first MPJ measuring about 1 cm x 1 cm freely movable subcutaneous tissue with digital deformities and nail disease with probability for trauma second and third second nail being worse.  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  Discussed removal of the second nail with possible digital fusion digits 2 3 left at 1 point in the future

## 2022-11-25 ENCOUNTER — Other Ambulatory Visit: Payer: Commercial Managed Care - PPO

## 2022-12-10 ENCOUNTER — Encounter: Payer: Self-pay | Admitting: Family Medicine

## 2022-12-10 ENCOUNTER — Other Ambulatory Visit: Payer: Self-pay | Admitting: Family

## 2022-12-10 ENCOUNTER — Other Ambulatory Visit: Payer: Self-pay

## 2022-12-10 DIAGNOSIS — J029 Acute pharyngitis, unspecified: Secondary | ICD-10-CM

## 2022-12-10 MED ORDER — ZOLPIDEM TARTRATE 5 MG PO TABS: 5.0000 mg | ORAL_TABLET | Freq: Every evening | ORAL | 0 refills | Status: DC | PRN

## 2022-12-28 ENCOUNTER — Encounter (INDEPENDENT_AMBULATORY_CARE_PROVIDER_SITE_OTHER): Payer: Self-pay | Admitting: Otolaryngology

## 2022-12-30 ENCOUNTER — Encounter: Payer: Self-pay | Admitting: Family Medicine

## 2022-12-31 ENCOUNTER — Other Ambulatory Visit: Payer: Self-pay

## 2022-12-31 MED ORDER — BUPROPION HCL ER (XL) 150 MG PO TB24: 150.0000 mg | ORAL_TABLET | Freq: Every day | ORAL | 3 refills | Status: DC

## 2022-12-31 MED ORDER — ROSUVASTATIN CALCIUM 20 MG PO TABS: 20.0000 mg | ORAL_TABLET | Freq: Every day | ORAL | 3 refills | Status: DC

## 2022-12-31 MED ORDER — LISINOPRIL 10 MG PO TABS: 10.0000 mg | ORAL_TABLET | Freq: Every day | ORAL | 3 refills | Status: DC

## 2023-01-04 ENCOUNTER — Encounter: Payer: Self-pay | Admitting: Family Medicine

## 2023-01-05 ENCOUNTER — Other Ambulatory Visit: Payer: Self-pay | Admitting: Family Medicine

## 2023-01-11 ENCOUNTER — Encounter: Payer: Self-pay | Admitting: Podiatry

## 2023-01-11 ENCOUNTER — Ambulatory Visit (INDEPENDENT_AMBULATORY_CARE_PROVIDER_SITE_OTHER): Payer: Commercial Managed Care - PPO | Admitting: Podiatry

## 2023-01-11 DIAGNOSIS — M2042 Other hammer toe(s) (acquired), left foot: Secondary | ICD-10-CM

## 2023-01-11 DIAGNOSIS — M6740 Ganglion, unspecified site: Secondary | ICD-10-CM | POA: Diagnosis not present

## 2023-01-13 NOTE — Progress Notes (Signed)
Subjective:   Patient ID: Curtis Hendrix, male   DOB: 62 y.o.   MRN: [id number]   HPI Patient states he has had a reoccurrence of the mass on the dorsum of the left first webspace and does have digital deformity second and third toes.  Stated he only had a short time that the mass was eradicated at last visit   ROS      Objective:  Physical Exam  Approximate 1 cm x 1 cm freely movable mass of the first interspace left with digital deformities digits 2 and 3 left elongation of the toes distal keratotic lesions     Assessment:  2 different problems with 1 being ganglionic cyst #2 being hammertoe deformity     Plan:  H&P reviewed both conditions and I do think draining the cyst could be attempted 1 more time but if it does not work it is going to require surgical excision and at this point I anesthetized 60 mg like Marcaine mixture sterile prep done aspirated the area getting out about a cc of gelatinous fluid and applied compression dressing.  I then discussed the digits and I do think at 1 point digital fusions will be necessary and I did review recovery and he seems to be more leaning towards removal of the mass if it reoccurs versus also doing the digital deformities and that can be decided in the future.  All questions answered

## 2023-01-14 ENCOUNTER — Ambulatory Visit (INDEPENDENT_AMBULATORY_CARE_PROVIDER_SITE_OTHER): Payer: Commercial Managed Care - PPO | Admitting: Audiology

## 2023-01-14 ENCOUNTER — Ambulatory Visit (INDEPENDENT_AMBULATORY_CARE_PROVIDER_SITE_OTHER): Payer: Commercial Managed Care - PPO | Admitting: Otolaryngology

## 2023-01-14 ENCOUNTER — Encounter (INDEPENDENT_AMBULATORY_CARE_PROVIDER_SITE_OTHER): Payer: Self-pay

## 2023-01-14 VITALS — Ht 74.0 in | Wt 207.0 lb

## 2023-01-14 DIAGNOSIS — R0982 Postnasal drip: Secondary | ICD-10-CM | POA: Diagnosis not present

## 2023-01-14 DIAGNOSIS — H903 Sensorineural hearing loss, bilateral: Secondary | ICD-10-CM

## 2023-01-14 DIAGNOSIS — J029 Acute pharyngitis, unspecified: Secondary | ICD-10-CM | POA: Diagnosis not present

## 2023-01-14 DIAGNOSIS — H918X3 Other specified hearing loss, bilateral: Secondary | ICD-10-CM

## 2023-01-14 DIAGNOSIS — K219 Gastro-esophageal reflux disease without esophagitis: Secondary | ICD-10-CM

## 2023-01-14 MED ORDER — PANTOPRAZOLE SODIUM 40 MG PO TBEC
40.0000 mg | DELAYED_RELEASE_TABLET | Freq: Every day | ORAL | 3 refills | Status: DC
Start: 2023-01-14 — End: 2023-04-22

## 2023-01-14 NOTE — Progress Notes (Signed)
Dear Dr. Durene Cal, Here is my assessment for our mutual patient, Curtis Hendrix. Thank you for allowing me the opportunity to care for your patient. Please do not hesitate to contact me should you have any other questions. Sincerely, Dr. Jovita Kussmaul  Otolaryngology Clinic Note Referring provider: Dr. Durene Cal HPI:  Curtis Hendrix is a 62 y.o. male kindly referred by Dr. Durene Cal for evaluation of sore throat and ear complaints  Patient reports that he has had a scratchy throat for the past 3-4 months - happens throughout the day. Coughing up mucus AM mostly. Maybe had some URI symptoms for about a week so ago, worse right now. No coughing most of the day. No antecedent event. He denies voice changes, dysphagia, odynophagia, hemoptysis, unintentional weight loss, neck masses, overt aspiration episodes, PNAs. No significant throat clearing - some post nasal drip. No significant allergy symptoms. Does spend a lot of time outside. Perhaps some reflux symptoms? Has not tried anything for it including lozenges Ear infections as a child but no surgeries except tubes as a child He has never tried amplification  Never smoked  Audiology: he feels like his hearing has gone down - gradual change but he's had known hearing loss decades ago. Never had MRI IAC, saw Dr. Ezzard Standing prior. No other otologic symptoms including pain, pressure, fullness, vertigo, facial numbness, ETD symptoms, facial weakness. He does hunt but wears hearing protection for at least 20 years; gun on right shoulder. No autoimmune disease, lyme disease. No facial numbness, pain, headaches, vision changes. No imbalance  PMHx: OSA on CPAP (AHI 5), HTN, Depression, HLD  H&N Surgery: no Personal or FHx of bleeding dz or anesthesia difficulty: no   GLP-1: no AP/AC: no  Independent Review of Additional Tests or Records:  01/14/2023 Audiogram was independently reviewed and interpreted by me and it reveals Left ear: normal downsloping to profound -  appears SNHL; asymmetric; 76% word interpretation at 85dB; type 76 tympanogram Right ear: normal downsloping to mild then upsloping to normal SNHL; 96% word interpretation at 15dB; type A tympanogram   SNHL= Sensorineural hearing loss   MRI 12/2014: reviewed independently, agree with read; no obvious IAC         PMH/Meds/All/SocHx/FamHx/ROS:   Past Medical History:  Diagnosis Date   Arthritis    Fracture of rib of left side 04/2013   Hearing difficulty    left ear-since college. high frequency loss.    Hypertension    Personal history of colonic adenomas 02/23/2013   02/23/2013 6 small polyps removed, 5 recovered - all adenomas repeat colonoscopy 02/2016     Radiculopathy, lumbar region    Sleep apnea    mild-uses CPAP     Past Surgical History:  Procedure Laterality Date   COLONOSCOPY  04/14/2016   [facility]   lumbar lamninectomy  2008   l4-l5 for drop foot   POLYPECTOMY     TONSILLECTOMY     TYMPANOSTOMY     tubes as child   WISDOM TOOTH EXTRACTION      Family History  Problem Relation Age of Onset   Transient ischemic attack Mother        age 70   Bladder Cancer Father    Kidney cancer Father    Kidney cancer Brother        renal cell carcinoma age 29- blood in urine   Colon cancer Neg Hx    Esophageal cancer Neg Hx    Rectal cancer Neg Hx    Stomach cancer Neg  Hx    Colon polyps Neg Hx      Social Connections: Not on file      Current Outpatient Medications:    atovaquone-proguanil (MALARONE) 250-100 MG TABS tablet, Take 1 tablet by mouth daily., Disp: , Rfl:    buPROPion (WELLBUTRIN XL) 150 MG 24 hr tablet, Take 1 tablet (150 mg total) by mouth daily., Disp: 90 tablet, Rfl: 3   lisinopril (ZESTRIL) 10 MG tablet, Take 1 tablet (10 mg total) by mouth daily., Disp: 90 tablet, Rfl: 3   Multiple Vitamins-Minerals (ONE-A-DAY MENS HEALTH FORMULA) TABS, Take by mouth., Disp: , Rfl:    rosuvastatin (CRESTOR) 10 MG tablet, TAKE 1 TABLET BY MOUTH EVERY DAY,  Disp: 90 tablet, Rfl: 3   rosuvastatin (CRESTOR) 20 MG tablet, Take 1 tablet (20 mg total) by mouth daily., Disp: 90 tablet, Rfl: 3   zolpidem (AMBIEN) 5 MG tablet, Take 1 tablet (5 mg total) by mouth at bedtime as needed for sleep (do not drive for 8 hours after taking)., Disp: 20 tablet, Rfl: 0   Physical Exam:   Ht 6\' 2"  (1.88 m)   Wt 207 lb (93.9 kg)   BMI 26.58 kg/m    Salient findings:  CN II-XII intact; EOM intact, no facial numbness, no nystagmus on EOM No nystagmus, normal gait  Bilateral EAC clear and TM intact with well pneumatized middle ear spaces Weber 512: midline Rinne 512: AC > BC b/l Anterior rhinoscopy: Septum relatively midline; bilateral inferior turbinates without significant hypertrophy No lesions of oral cavity/oropharynx; No obviously palpable neck masses/lymphadenopathy/thyromegaly No respiratory distress or stridor; voice quality class 1  Procedures:  Procedure Note Pre-procedure diagnosis:  Chronic throat pain, post nasal drip Post-procedure diagnosis: Same Procedure: Transnasal Fiberoptic Laryngoscopy, CPT 16109 - Mod 25 Indication: chronic throat pain, post nasal drip Complications: None apparent EBL: 0 mL Date: 01/14/23   The procedure was undertaken to further evaluate the patient's complaint of chronic throat discomfort, post nasal drip, with mirror exam inadequate for appropriate examination due to gag reflex and poor patient tolerance  Procedure:  Patient was identified as correct patient. Verbal consent was obtained. The nose was sprayed with oxymetazoline and 4% lidocaine. The The flexible laryngoscope was passed through the nose to view the nasal cavity, pharynx (oropharynx, hypopharynx) and larynx.  The larynx was examined at rest and during multiple phonatory tasks. Documentation was obtained and reviewed with patient. The scope was removed. The patient tolerated the procedure well.  Findings: The nasal cavity and nasopharynx did not reveal  any masses or lesions, mucosa appeared to be without obvious lesions. The tongue base, pharyngeal walls, piriform sinuses, vallecula, epiglottis and postcricoid region are normal in appearance. The visualized portion of the subglottis and proximal trachea is widely patent. The vocal folds are mobile bilaterally. There are no lesions on the free edge of the vocal folds nor elsewhere in the larynx worrisome for malignancy.     Electronically signed by: Read Drivers, MD 01/14/2023 3:33 PM   Impression & Plans:  Curtis Hendrix is a 62 y.o. male with multiple complaints: Asymmetric left SNHL - reports has been ongoing for several years; has had several MRIs but does not appear he has had any MRI IAC. He reports that he had a few audiograms over the years and his hearing thresholds appear to be same, but these are not available for my review and he says it has been several years since this was checked. It is unclear to me what has  precipitated this - he does have noise exposure but reports he wears hearing protection. Prior MRI of H&N do not show any concerning retrocochlear findings but will obtain dedicated MRI IAC 2. Sore throat: ongoing for 3-4 months. TFL is reassuring and he has not other complaints. We discussed laryngeal hydration and can trial a PPI/Reflux gourmet to see if it will help. He was reassured after the TFL. Suspect LPR - Start protonix daily - Reflux gourmet after meals  See below regarding exact medications prescribed this encounter including dosages and route: Meds ordered this encounter  Medications   pantoprazole (PROTONIX) 40 MG tablet    Sig: Take 1 tablet (40 mg total) by mouth daily.    Dispense:  30 tablet    Refill:  3   - we discussed follow up; he would like to observe the throat for now, which is reasonable. We will get repeat audio in 1 year and I will go over the MRI with him once it results. F/u in 1 year for recheck   Thank you for allowing me the opportunity  to care for your patient. Please do not hesitate to contact me should you have any other questions.  Sincerely, Jovita Kussmaul, MD Otolarynoglogist (ENT), [facility] Phone: [phone] Fax: [phone]  01/14/2023, 3:33 PM   MDM:  Level 4 Data complexity: mod - independent interpretation of MRI - Morbidity: mod - Prescription Drug prescribed or managed: yes

## 2023-01-14 NOTE — Progress Notes (Signed)
[address] [place], [place] [address] [id number]  Audiological Evaluation    Name: Curtis Hendrix     DOB:   1960/06/04      MRN:   [id number]                                                                                     Service Date: 01/14/2023     Accompanied by: none   Patient comes today after Dr. Allena Katz, ENT sent a referral for a hearing evaluation due to concerns with hearing loss.   Symptoms Yes Details  Hearing loss  [x]  Recalls having a hearing test many years ago in [place] and was told to have a hearing loss notch in the left ear. He now perceives hearing loss in both ears.  Tinnitus  [x]  Seldom in the right ear - not perceived in a while.  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  Previous ear surgeries  [x]  Reports recurrent ear infections as a child and that he had tubes placed in his ears.  Family history  [x]  Two sisters of his wear hearing aids  Amplification  []    Other  []      Otoscopy: Right ear: Clear external ear canals and notable landmarks visualized on the tympanic membrane. Left ear:  Clear external ear canals and notable landmarks visualized on the tympanic membrane. Some growths were observed in both external ear canals.  Tympanometry: Right ear: Type Ad- Normal external ear canal volume with normal middle ear pressure and high tympanic membrane compliance Left ear: Type Ad- Normal external ear canal volume with normal middle ear pressure and high tympanic membrane compliance  Pure tone Audiometry: Right ear- Normal hearing from [phone] Hz, then mild sensorineural hearing loss rising to normal from 2000-8000 Hz   Left ear-  Normal hearing from [phone] Hz, then mild to profound sensorineural hearing loss notch from 2000-8000 Hz    The hearing test results were completed under headphones and results are deemed to be of  good reliability. Test technique:  conventional     Speech Audiometry: Right ear- Speech Reception Threshold (SRT) was obtained at 15 dBHL Left ear-Speech Reception Threshold (SRT) was obtained at 20 dBHL   Word Recognition Score Tested using NU-6 (MLV) Right ear: 96% was obtained at a presentation level of 70 dBHL with contralateral masking which is deemed as  excellent. Left ear: 76% was obtained at a presentation level of 85 dBHL (reported most comfortable level) with contralateral masking which is deemed as  fair.    Impression: There is a significant difference in pure-tone thresholds between ears, worse in the left ear. There is a significant difference in word recognition scores , worse of the left ear.     Recommendations: Follow up with ENT as scheduled for today. Return for a hearing evaluation if concerns with hearing changes arise or per MD recommendation. Use hearing protection when exposed to loud/damaging sounds. Consider a communication needs assessment after medical clearance for hearing aids is  obtained.   Shahid Flori MARIE LEROUX-MARTINEZ, AUD

## 2023-01-14 NOTE — Patient Instructions (Addendum)
Reflux Gourmet - do it after you eat (Get it at [facility]) Take pantoprazole 40mg  once a day - 20 minutes before breakfast Drink at least 48-64 oz of water per day I have ordered an MRI study for you to complete prior to your next visit. Please call [facility] Scheduling at [phone] to schedule your imaging if you have not received a call within 24 hours. If you are unable to complete your imaging study prior to your next scheduled visit please call our office to let us know.

## 2023-02-02 ENCOUNTER — Other Ambulatory Visit: Payer: Self-pay | Admitting: Internal Medicine

## 2023-02-03 ENCOUNTER — Encounter: Payer: Self-pay | Admitting: Internal Medicine

## 2023-02-25 ENCOUNTER — Encounter: Payer: Self-pay | Admitting: Internal Medicine

## 2023-04-12 ENCOUNTER — Ambulatory Visit (AMBULATORY_SURGERY_CENTER): Payer: Commercial Managed Care - PPO

## 2023-04-12 VITALS — Ht 74.0 in | Wt 206.0 lb

## 2023-04-12 DIAGNOSIS — Z860100 Personal history of colon polyps, unspecified: Secondary | ICD-10-CM

## 2023-04-12 NOTE — Progress Notes (Signed)
No egg or soy allergy known to patient  No issues known to pt with past sedation with any surgeries or procedures Patient denies ever being told they had issues or difficulty with intubation  No FH of Malignant Hyperthermia Pt is not on diet pills Pt states not taking GLP medications Pt is not on  home 02  Pt is not on blood thinners  Pt denies issues with constipation  No A fib or A flutter Have any cardiac testing pending--no Ambulates independently Patient's chart reviewed by Cathlyn Parsons CNRA prior to previsit and patient appropriate for the [facility].  Previsit completed and red dot placed by patient's name on their procedure day (on provider's schedule).

## 2023-04-19 ENCOUNTER — Encounter: Payer: Self-pay | Admitting: Internal Medicine

## 2023-04-22 ENCOUNTER — Other Ambulatory Visit (INDEPENDENT_AMBULATORY_CARE_PROVIDER_SITE_OTHER): Payer: Self-pay | Admitting: Family Medicine

## 2023-04-27 NOTE — Progress Notes (Unsigned)
[facility] History and Physical   Primary Care Physician:  Shelva Majestic, MD   Reason for Procedure:  History of colon polyps  Plan:    Colonoscopy     HPI: Curtis Hendrix is a 63 y.o. male with a history of recurrent adenomatous colon polyps, having had 4 in 2021 5 in 2018 and 5 in 2014.  He presents for a surveillance colonoscopy.   Past Medical History:  Diagnosis Date   Arthritis    Fracture of rib of left side 04/2013   Hearing difficulty    left ear-since college. high frequency loss.    Hypertension    Personal history of colonic adenomas 02/23/2013   02/23/2013 6 small polyps removed, 5 recovered - all adenomas repeat colonoscopy 02/2016     Radiculopathy, lumbar region    Sleep apnea    mild-uses CPAP    Past Surgical History:  Procedure Laterality Date   COLONOSCOPY  04/14/2016   [facility]   lumbar lamninectomy  2008   l4-l5 for drop foot   POLYPECTOMY     TONSILLECTOMY     TYMPANOSTOMY     tubes as child   WISDOM TOOTH EXTRACTION      Prior to Admission medications   Medication Sig Start Date End Date Taking? Authorizing Provider  buPROPion (WELLBUTRIN XL) 150 MG 24 hr tablet Take 1 tablet (150 mg total) by mouth daily. 12/31/22   Shelva Majestic, MD  lisinopril (ZESTRIL) 10 MG tablet Take 1 tablet (10 mg total) by mouth daily. 12/31/22   Shelva Majestic, MD  Multiple Vitamins-Minerals (ONE-A-DAY MENS HEALTH FORMULA) TABS Take by mouth.    [provider]  Omega 3 1000 MG CAPS Take 2 capsules by mouth daily. 08/19/10   [provider]  pantoprazole (PROTONIX) 40 MG tablet TAKE 1 TABLET BY MOUTH EVERY DAY 04/22/23   Shelva Majestic, MD  rosuvastatin (CRESTOR) 20 MG tablet Take 1 tablet (20 mg total) by mouth daily. 12/31/22   Shelva Majestic, MD  Study - CAPTIVA - aspirin 81 mg tablet (PI-Sethi) Chew 1 tablet by mouth daily. 08/19/10   [provider]    Current Outpatient Medications  Medication Sig Dispense  Refill   buPROPion (WELLBUTRIN XL) 150 MG 24 hr tablet Take 1 tablet (150 mg total) by mouth daily. 90 tablet 3   lisinopril (ZESTRIL) 10 MG tablet Take 1 tablet (10 mg total) by mouth daily. 90 tablet 3   Multiple Vitamins-Minerals (ONE-A-DAY MENS HEALTH FORMULA) TABS Take by mouth.     Omega 3 1000 MG CAPS Take 2 capsules by mouth daily.     pantoprazole (PROTONIX) 40 MG tablet TAKE 1 TABLET BY MOUTH EVERY DAY 90 tablet 1   rosuvastatin (CRESTOR) 20 MG tablet Take 1 tablet (20 mg total) by mouth daily. 90 tablet 3   Study - CAPTIVA - aspirin 81 mg tablet (PI-Sethi) Chew 1 tablet by mouth daily.     No current facility-administered medications for this visit.    Allergies as of 04/28/2023   (No Known Allergies)    Family History  Problem Relation Age of Onset   Transient ischemic attack Mother        age 37   Bladder Cancer Father    Kidney cancer Father    Kidney cancer Brother        renal cell carcinoma age 90- blood in urine   Colon cancer Neg Hx    Esophageal cancer Neg Hx  Rectal cancer Neg Hx    Stomach cancer Neg Hx    Colon polyps Neg Hx     Social History   Socioeconomic History   Marital status: Married    Spouse name: Not on file   Number of children: Not on file   Years of education: Not on file   Highest education level: Not on file  Occupational History   Occupation: [employer]  Tobacco Use   Smoking status: Never   Smokeless tobacco: Never  Vaping Use   Vaping status: Never Used  Substance and Sexual Activity   Alcohol use: Yes    Alcohol/week: 6.0 standard drinks of alcohol    Types: 6 Cans of beer per week   Drug use: No   Sexual activity: Yes  Other Topics Concern   Not on file  Social History Narrative   Married. 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  Physical Exam: Vital signs There were no vitals taken for this visit.  General:   Alert,  Well-developed, well-nourished, pleasant and cooperative in NAD Lungs:  Clear throughout to auscultation.   Heart:  Regular rate and rhythm; no murmurs, clicks, rubs,  or gallops. Abdomen:  Soft, nontender and nondistended. Normal bowel sounds.   Neuro/Psych:  Alert and cooperative. Normal mood and affect. A and O x 3   @Jibreel Fedewa  Sena Slate, MD, [facility] [phone] (pager) 04/27/2023 5:11 PM@

## 2023-04-28 ENCOUNTER — Ambulatory Visit: Payer: Commercial Managed Care - PPO | Admitting: Internal Medicine

## 2023-04-28 ENCOUNTER — Encounter: Payer: Self-pay | Admitting: Internal Medicine

## 2023-04-28 VITALS — BP 112/73 | HR 70 | Temp 98.4°F | Resp 15 | Ht 74.0 in | Wt 206.0 lb

## 2023-04-28 DIAGNOSIS — K5730 Diverticulosis of large intestine without perforation or abscess without bleeding: Secondary | ICD-10-CM | POA: Diagnosis not present

## 2023-04-28 DIAGNOSIS — Z1211 Encounter for screening for malignant neoplasm of colon: Secondary | ICD-10-CM | POA: Diagnosis present

## 2023-04-28 DIAGNOSIS — K5790 Diverticulosis of intestine, part unspecified, without perforation or abscess without bleeding: Secondary | ICD-10-CM

## 2023-04-28 DIAGNOSIS — Z860101 Personal history of adenomatous and serrated colon polyps: Secondary | ICD-10-CM | POA: Diagnosis not present

## 2023-04-28 DIAGNOSIS — Z860100 Personal history of colon polyps, unspecified: Secondary | ICD-10-CM

## 2023-04-28 DIAGNOSIS — D123 Benign neoplasm of transverse colon: Secondary | ICD-10-CM | POA: Diagnosis not present

## 2023-04-28 DIAGNOSIS — D128 Benign neoplasm of rectum: Secondary | ICD-10-CM | POA: Diagnosis not present

## 2023-04-28 MED ORDER — SODIUM CHLORIDE 0.9 % IV SOLN
500.0000 mL | Freq: Once | INTRAVENOUS | Status: DC
Start: 2023-04-28 — End: 2023-04-28

## 2023-04-28 NOTE — Progress Notes (Signed)
Called to room to assist during endoscopic procedure.  Patient ID and intended procedure confirmed with present staff. Received instructions for my participation in the procedure from the performing physician.

## 2023-04-28 NOTE — Progress Notes (Signed)
Pt's states no medical or surgical changes since previsit or office visit.

## 2023-04-28 NOTE — Progress Notes (Signed)
Report to PACU, RN, vss, BBS= Clear.

## 2023-04-28 NOTE — Patient Instructions (Addendum)
Only 2 tiny polyps today.  You still have diverticulosis - thickened muscle rings and pouches in the colon wall.   Your next routine colonoscopy should be in 5 years - 2030.  I appreciate the opportunity to care for you. Iva Boop, MD, [facility]  Handout provided on polyps and diverticulosis.    YOU HAD AN ENDOSCOPIC PROCEDURE TODAY AT THE [facility]:   Refer to the procedure report that was given to you for any specific questions about what was found during the examination.  If the procedure report does not answer your questions, please call your gastroenterologist to clarify.  If you requested that your care partner not be given the details of your procedure findings, then the procedure report has been included in a sealed envelope for you to review at your convenience later.  YOU SHOULD EXPECT: Some feelings of bloating in the abdomen. Passage of more gas than usual.  Walking can help get rid of the air that was put into your GI tract during the procedure and reduce the bloating. If you had a lower endoscopy (such as a colonoscopy or flexible sigmoidoscopy) you may notice spotting of blood in your stool or on the toilet paper. If you underwent a bowel prep for your procedure, you may not have a normal bowel movement for a few days.  Please Note:  You might notice some irritation and congestion in your nose or some drainage.  This is from the oxygen used during your procedure.  There is no need for concern and it should clear up in a day or so.  SYMPTOMS TO REPORT IMMEDIATELY:  Following lower endoscopy (colonoscopy or flexible sigmoidoscopy):  Excessive amounts of blood in the stool  Significant tenderness or worsening of abdominal pains  Swelling of the abdomen that is new, acute  Fever of 100F or higher  For urgent or emergent issues, a gastroenterologist can be reached at any hour by calling [phone]. Do not use MyChart messaging for urgent concerns.    DIET:   We do recommend a small meal at first, but then you may proceed to your regular diet.  Drink plenty of fluids but you should avoid alcoholic beverages for 24 hours.  ACTIVITY:  You should plan to take it easy for the rest of today and you should NOT DRIVE or use heavy machinery until tomorrow (because of the sedation medicines used during the test).    FOLLOW UP: Our staff will call the number listed on your records the next business day following your procedure.  We will call around 7:15- 8:00 am to check on you and address any questions or concerns that you may have regarding the information given to you following your procedure. If we do not reach you, we will leave a message.     If any biopsies were taken you will be contacted by phone or by letter within the next 1-3 weeks.  Please call us at [phone] if you have not heard about the biopsies in 3 weeks.    SIGNATURES/CONFIDENTIALITY: You and/or your care partner have signed paperwork which will be entered into your electronic medical record.  These signatures attest to the fact that that the information above on your After Visit Summary has been reviewed and is understood.  Full responsibility of the confidentiality of this discharge information lies with you and/or your care-partner.

## 2023-04-28 NOTE — Op Note (Signed)
[facility] Patient Name: Curtis Hendrix Procedure Date: 04/28/2023 8:40 AM MRN: [id number] Endoscopist: Iva Boop , MD, [id number] Age: 63 Referring MD:  Date of Birth: 02/16/1961 Gender: Male Account #: [id number] Procedure:                Colonoscopy Indications:              Surveillance: Personal history of adenomatous                            polyps on last colonoscopy > 3 years ago, Last                            colonoscopy: November 2021 Medicines:                Monitored Anesthesia Care Procedure:                Pre-Anesthesia Assessment:                           - Prior to the procedure, a History and Physical                            was performed, and patient medications and                            allergies were reviewed. The patient's tolerance of                            previous anesthesia was also reviewed. The risks                            and benefits of the procedure and the sedation                            options and risks were discussed with the patient.                            All questions were answered, and informed consent                            was obtained. Prior Anticoagulants: The patient has                            taken no anticoagulant or antiplatelet agents. ASA                            Grade Assessment: II - A patient with mild systemic                            disease. After reviewing the risks and benefits,                            the patient was deemed in satisfactory condition to  undergo the procedure.                           After obtaining informed consent, the colonoscope                            was passed under direct vision. Throughout the                            procedure, the patient's blood pressure, pulse, and                            oxygen saturations were monitored continuously. The                            Olympus Scope [id number] was introduced  through the                            anus and advanced to the the cecum, identified by                            appendiceal orifice and ileocecal valve. The                            colonoscopy was performed without difficulty. The                            patient tolerated the procedure well. The quality                            of the bowel preparation was adequate. The                            ileocecal valve, appendiceal orifice, and rectum                            were photographed. The bowel preparation used was                            Miralax via split dose instruction. Scope In: 8:52:29 AM Scope Out: 9:12:10 AM Scope Withdrawal Time: 0 hours 16 minutes 19 seconds  Total Procedure Duration: 0 hours 19 minutes 41 seconds  Findings:                 The perianal and digital rectal examinations were                            normal.                           Two sessile polyps were found in the rectum and                            transverse colon. The polyps were diminutive in  size. These polyps were removed with a cold snare.                            Resection and retrieval were complete. Verification                            of patient identification for the specimen was                            done. Estimated blood loss was minimal.                           Multiple large-mouthed diverticula were found in                            the sigmoid colon and descending colon.                           The exam was otherwise without abnormality on                            direct and retroflexion views. Complications:            No immediate complications. Estimated Blood Loss:     Estimated blood loss was minimal. Impression:               - Two diminutive polyps in the rectum and in the                            transverse colon, removed with a cold snare.                            Resected and retrieved.                            - Diverticulosis in the sigmoid colon and in the                            descending colon.                           - The examination was otherwise normal on direct                            and retroflexion views.                           - Personal history of colonic polyps. 02/23/2013 6                            small polyps removed, 5 recovered all adenomas                           04/14/2016 5 polyps removed - all adenomas max 8 mm  02/13/2020 4 adenomas max 6 mm Recommendation:           - Patient has a contact number available for                            emergencies. The signs and symptoms of potential                            delayed complications were discussed with the                            patient. Return to normal activities tomorrow.                            Written discharge instructions were provided to the                            patient.                           - Resume previous diet.                           - Continue present medications.                           - Await pathology results.                           - Repeat colonoscopy in 5 years for surveillance. Iva Boop, MD 04/28/2023 9:19:24 AM This report has been signed electronically.

## 2023-04-29 ENCOUNTER — Telehealth: Payer: Self-pay

## 2023-04-29 NOTE — Telephone Encounter (Signed)
LMOM

## 2023-05-04 LAB — SURGICAL PATHOLOGY

## 2023-05-05 ENCOUNTER — Encounter: Payer: Self-pay | Admitting: Internal Medicine

## 2023-05-17 ENCOUNTER — Encounter: Payer: Self-pay | Admitting: Family Medicine

## 2023-05-18 ENCOUNTER — Other Ambulatory Visit: Payer: Self-pay | Admitting: Family Medicine

## 2023-05-18 MED ORDER — ZOLPIDEM TARTRATE 5 MG PO TABS: 5.0000 mg | ORAL_TABLET | Freq: Every evening | ORAL | 0 refills | Status: DC | PRN

## 2023-06-04 ENCOUNTER — Encounter: Payer: Commercial Managed Care - PPO | Admitting: Family Medicine

## 2023-07-15 ENCOUNTER — Encounter: Payer: Self-pay | Admitting: Family Medicine

## 2023-07-22 ENCOUNTER — Ambulatory Visit: Payer: Commercial Managed Care - PPO | Admitting: Family Medicine

## 2023-07-22 ENCOUNTER — Encounter: Payer: Self-pay | Admitting: Family Medicine

## 2023-07-22 ENCOUNTER — Other Ambulatory Visit: Payer: Self-pay | Admitting: Family Medicine

## 2023-07-22 VITALS — BP 148/82 | HR 59 | Temp 97.7°F | Ht 74.0 in | Wt 208.2 lb

## 2023-07-22 DIAGNOSIS — Z0000 Encounter for general adult medical examination without abnormal findings: Secondary | ICD-10-CM | POA: Diagnosis not present

## 2023-07-22 DIAGNOSIS — Z125 Encounter for screening for malignant neoplasm of prostate: Secondary | ICD-10-CM

## 2023-07-22 DIAGNOSIS — E785 Hyperlipidemia, unspecified: Secondary | ICD-10-CM | POA: Diagnosis not present

## 2023-07-22 DIAGNOSIS — E663 Overweight: Secondary | ICD-10-CM | POA: Diagnosis not present

## 2023-07-22 DIAGNOSIS — Z131 Encounter for screening for diabetes mellitus: Secondary | ICD-10-CM | POA: Diagnosis not present

## 2023-07-22 DIAGNOSIS — Z79899 Other long term (current) drug therapy: Secondary | ICD-10-CM

## 2023-07-22 DIAGNOSIS — E871 Hypo-osmolality and hyponatremia: Secondary | ICD-10-CM

## 2023-07-22 LAB — LIPID PANEL
Cholesterol: 146 mg/dL (ref 0–200)
HDL: 65.1 mg/dL (ref >39.00)
LDL Cholesterol: 67 mg/dL (ref 0–99)
NonHDL: 80.82
Total CHOL/HDL Ratio: 2
Triglycerides: 71 mg/dL (ref 0.0–149.0)
VLDL: 14.2 mg/dL (ref 0.0–40.0)

## 2023-07-22 LAB — PSA: PSA: 0.51 ng/mL (ref 0.10–4.00)

## 2023-07-22 LAB — COMPREHENSIVE METABOLIC PANEL WITH GFR
ALT: 29 U/L (ref 0–53)
AST: 27 U/L (ref 0–37)
Albumin: 4.7 g/dL (ref 3.5–5.2)
Alkaline Phosphatase: 45 U/L (ref 39–117)
BUN: 10 mg/dL (ref 6–23)
CO2: 26 meq/L (ref 19–32)
Calcium: 9.5 mg/dL (ref 8.4–10.5)
Chloride: 97 meq/L (ref 96–112)
Creatinine, Ser: 0.94 mg/dL (ref 0.40–1.50)
GFR: 87.64 mL/min (ref >60.00)
Glucose, Bld: 92 mg/dL (ref 70–99)
Potassium: 4.2 meq/L (ref 3.5–5.1)
Sodium: 132 meq/L — ABNORMAL LOW (ref 135–145)
Total Bilirubin: 1 mg/dL (ref 0.2–1.2)
Total Protein: 7 g/dL (ref 6.0–8.3)

## 2023-07-22 LAB — CBC WITH DIFFERENTIAL/PLATELET
Basophils Absolute: 0 10*3/uL (ref 0.0–0.1)
Basophils Relative: 0.9 % (ref 0.0–3.0)
Eosinophils Absolute: 0.1 10*3/uL (ref 0.0–0.7)
Eosinophils Relative: 1.2 % (ref 0.0–5.0)
HCT: 42 % (ref 39.0–52.0)
Hemoglobin: 14.5 g/dL (ref 13.0–17.0)
Lymphocytes Relative: 29.7 % (ref 12.0–46.0)
Lymphs Abs: 1.5 10*3/uL (ref 0.7–4.0)
MCHC: 34.6 g/dL (ref 30.0–36.0)
MCV: 97 fl (ref 78.0–100.0)
Monocytes Absolute: 0.6 10*3/uL (ref 0.1–1.0)
Monocytes Relative: 12.2 % — ABNORMAL HIGH (ref 3.0–12.0)
Neutro Abs: 2.8 10*3/uL (ref 1.4–7.7)
Neutrophils Relative %: 56 % (ref 43.0–77.0)
Platelets: 212 10*3/uL (ref 150.0–400.0)
RBC: 4.33 Mil/uL (ref 4.22–5.81)
RDW: 13.3 % (ref 11.5–15.5)
WBC: 4.9 10*3/uL (ref 4.0–10.5)

## 2023-07-22 LAB — VITAMIN B12: Vitamin B-12: 511 pg/mL (ref 211–911)

## 2023-07-22 LAB — HEMOGLOBIN A1C: Hgb A1c MFr Bld: 5.3 % (ref 4.6–6.5)

## 2023-07-22 MED ORDER — DOXYCYCLINE HYCLATE 100 MG PO TABS: 200.0000 mg | ORAL_TABLET | Freq: Once | ORAL | 0 refills | Status: AC

## 2023-07-22 MED ORDER — ZOLPIDEM TARTRATE 5 MG PO TABS: 5.0000 mg | ORAL_TABLET | Freq: Every evening | ORAL | 0 refills | Status: DC | PRN

## 2023-07-22 MED ORDER — BUPROPION HCL 75 MG PO TABS: 37.5000 mg | ORAL_TABLET | Freq: Two times a day (BID) | ORAL | 0 refills | Status: AC

## 2023-07-22 NOTE — Patient Instructions (Addendum)
 depression remains well controlled - wants to try to wean off Wellbutrin  completely- had gone down to 150 from 300 previously- he will take half of 75 mg instant release that we sent in today for 2-3 weeks then can stop completely  Prior MRI was ordered at [place] [phone] ask for MRI/imaging  blood pressure slightly high today but was expecting labs done and results ready based on miscommunication and this was stressful - he agreed to monitor at home for a few days and update me next week - if still high may need to adjust dose  Please stop by lab before you go If you have mychart- we will send your results within 3 business days of us  receiving them.  If you do not have mychart- we will call you about results within 5 business days of us  receiving them.  *please also note that you will see labs on mychart as soon as they post. I will later go in and write notes on them- will say "notes from Dr. Arlene Ben"   Recommended follow up: Return in about 1 year (around 07/21/2024) for physical or sooner if needed.Schedule b4 you leave.

## 2023-07-22 NOTE — Progress Notes (Signed)
 Phone: [phone]   Subjective:  Patient presents today for their annual physical. Chief complaint-noted.   See problem oriented charting- ROS- full  review of systems was completed and negative  except for: tick bite, frustration about labs today  The following were reviewed and entered/updated in epic: Past Medical History:  Diagnosis Date   Arthritis    Depression    Fracture of rib of left side 04/2013   Hearing difficulty    left ear-since college. high frequency loss.    Hypertension    Personal history of colonic adenomas 02/23/2013   02/23/2013 6 small polyps removed, 5 recovered - all adenomas repeat colonoscopy 02/2016     Radiculopathy, lumbar region    Sleep apnea    mild-uses CPAP   Patient Active Problem List   Diagnosis Date Noted   BPH associated with nocturia 06/25/2015    Priority: Medium    OSA (obstructive sleep apnea) 02/15/2014    Priority: Medium    Depression 11/07/2013    Priority: Medium    Oral herpes simplex infection 11/07/2013    Priority: Medium    Hyperlipidemia 09/09/2007    Priority: Medium    Essential hypertension 06/10/2007    Priority: Medium    Insomnia 12/18/2013    Priority: Low   History of colonic polyps 02/23/2013    Priority: Low   Chronic prostatitis 09/09/2007    Priority: Low   CHEST PAIN UNSPECIFIED 07/19/2007    Priority: Low   Healthcare maintenance 01/10/2020   Past Surgical History:  Procedure Laterality Date   COL[facility]OSCOPY  04/14/2016   [facility]   EYE SURGERY     lumbar lamninectomy  2008   l4-l5 for drop foot   POLYPECTOMY     SPINE SURGERY     TONSILLECTOMY     TYMPANOSTOMY     tubes as child   WISDOM TOOTH EXTRACTION      Family History  Problem Relation Age of Onset   Transient ischemic attack Mother        age 15   Bladder Cancer Father    Kidney cancer Father    Cancer Father    Kidney disease Father    Kidney cancer Brother        renal cell carcinoma age 79- blood in urine   Colon  cancer Neg Hx    Esophageal cancer Neg Hx    Rectal cancer Neg Hx    Stomach cancer Neg Hx    Colon polyps Neg Hx     Medications- reviewed and updated Current Outpatient Medications  Medication Sig Dispense Refill   buPROPion  (WELLBUTRIN ) 75 MG tablet Take 0.5 tablets (37.5 mg total) by mouth 2 (two) times daily. After 2-3 weeks of this if still feeling well can stop completely. 30 tablet 0   doxycycline  (VIBRA -TABS) 100 MG tablet Take 2 tablets (200 mg total) by mouth once for 1 dose. 2 tablet 0   lisinopril  (ZESTRIL ) 10 MG tablet Take 1 tablet (10 mg total) by mouth daily. 90 tablet 3   Multiple Vitamins-Minerals (ONE-A-DAY MENS HEALTH FORMULA) TABS Take by mouth.     Omega 3 1000 MG CAPS Take 2 capsules by mouth daily.     pantoprazole  (PROTONIX ) 40 MG tablet TAKE 1 TABLET BY MOUTH EVERY DAY 90 tablet 1   rosuvastatin  (CRESTOR ) 20 MG tablet Take 1 tablet (20 mg total) by mouth daily. 90 tablet 3   Study - CAPTIVA - aspirin 81 mg tablet (PI-Sethi) Chew 1 tablet  by mouth daily.     zolpidem  (AMBIEN ) 5 MG tablet Take 1 tablet (5 mg total) by mouth at bedtime as needed for sleep. 21 tablet 0   No current facility-administered medications for this visit.    Allergies-reviewed and updated No Known Allergies  Social History   Social History Narrative   Married. 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 Oropharynx normal Neck: no thyromegaly CV: RRR no murmurs  rubs or gallops Lungs: CTAB no crackles, wheeze, rhonchi Abdomen: soft/nontender/nondistended/normal bowel sounds. No rebound or guarding.  Ext: no edema Skin: warm, dry Neuro: grossly normal, moves all extremities, PERRLA Declines genitourinary and rectal exam today    Assessment and Plan  63 y.o. male presenting for annual physical.  Health Maintenance counseling: 1. Anticipatory guidance: Patient counseled regarding regular dental exams -q4 months, eye exams - yearly,  avoiding smoking and second hand smoke , limiting alcohol to 2 beverages per day - 6-12 a week, no illicit drugs.   2. Risk factor reduction:  Advised patient of need for regular exercise and diet rich and fruits and vegetables to reduce risk of heart attack and stroke.  Exercise- 4 days a week bootcamp [employer] and some yoga as well- [employer] and pilates class at [employer] .  Diet/weight management-working on cutting visceral fat- had study  with [facility]  at [employer]. Down 6 lbs from last year and working to change weight distribution.  Wt Readings from Last 3 Encounters:  07/22/23 208 lb 3.2 oz (94.4 kg)  04/28/23 206 lb (93.4 kg)  04/12/23 206 lb (93.4 kg)  3. Immunizations/screenings/ancillary studies- Prevnar 20 consider in future, otherwise up to date- holding off on covids Immunization History  Administered Date(s) Administered   Influenza Inj Mdck Quad Pf 03/07/2022   Influenza Split 12/28/2011, 12/16/2018   Influenza Whole 03/17/2003   Influenza,inj,Quad PF,6+ Mos 02/15/2014, 12/04/2014, 02/18/2016, 02/01/2018, 12/11/2019   Influenza-Unspecified 02/15/2014, 12/04/2014, 02/18/2016, 02/01/2018, 12/11/2019   Moderna Covid-19 Vaccine Bivalent Booster 50yrs & up 01/28/2021   Moderna Sars-Covid-2 Vaccination 01/10/2020   PFIZER(Purple Top)SARS-COV-2 Vaccination 06/02/2019, 06/27/2019   Td 03/17/1999, 06/29/2008   Tdap 10/25/2018   Zoster Recombinant(Shingrix ) 07/13/2016, 09/22/2016    Zoster, Live 06/25/2015  4. Prostate cancer screening- low risk prior trend- update psa today   Lab Results  Component Value Date   PSA 0.66 04/07/2022   PSA 0.48 12/07/2019   PSA 0.43 10/25/2018   5. Colon cancer screening - History of adenomatous colon polyps February 2025 with 5-year repeat with Dr. Willy Harvest  6. Skin cancer screening- Dr. Marcia Setters melanoma in situ. advised regular sunscreen use. Denies worrisome, changing, or new skin lesions.  7. Smoking associated screening (lung cancer screening, AAA screen 65-75, UA)- never smoker 8. STD screening - only active with wife  Status of chronic or  acute concerns   #Tick bite - noted this morning- in woods 3 days ago about so may have been on several days. Pulled off quickly and not sure if engorged. [place] of [place]- still knows if develops flu like symptoms to let us  know immediately  #hearing loss- saw Dr. Lydia Sams- MRI planned with asymmetry  - he will call to schedule  #Sore throat- started on protonix  in October for laryngopharyngeal reflux - no further issues  #ganglion cyst on left foot- has seen DrCelia Coles late 2024- also with hammertoe- may need surgery in future - cyst issue better- still with hammertoe   #hypertension S: medication: lisinopril  10 mg Home readings #s: no recent checks BP Readings from Last 3 Encounters:  07/22/23 (!) 148/82  04/28/23 112/73  04/29/22 134/82  A/P: blood pressure slightly high today but was expecting labs done and results ready based on miscommunication and this was stressful - he agreed to monitor at home for a few days and update me next week - if still high may need to adjust dose  #subclavian artery stenosis- saw Dr. Vikki Graves 04/29/22 - plan for q2 year follow up- remain on statin and aspirin  #hyperlipidemia S: Medication: plan last year was to increase rosuvastatin  20 mg and remain on aspirin , also on metamucil  Lab Results  Component Value Date    CHOL 188 04/07/2022   HDL 82.70 04/07/2022   LDLCALC 81 04/07/2022   LDLDIRECT 115.6 01/02/2014   TRIG 124.0 04/07/2022   CHOLHDL 2 04/07/2022   A/P: lipids hopefully improved- update today with labs  # Depression S: Medication:decreased to 150 mg dose of Wellbutrin  previously. Has been doing well lately and wants to look at coming off.      07/22/2023   10:30 AM 04/07/2022    1:22 PM 08/04/2021   11:29 AM  Depression screen PHQ 2/9  Decreased Interest 0 0 0  Down, Depressed, Hopeless 0 0 0  PHQ - 2 Score 0 0 0  Altered sleeping  0 3  Tired, decreased energy  0 0  Change in appetite  0 0  Feeling bad or failure about yourself   0 0  Trouble concentrating  0 0  Moving slowly or fidgety/restless  0 0  Suicidal thoughts  0 0  PHQ-9 Score  0 3  Difficult doing work/chores  Not difficult at all Not difficult at all  A/P: depression remains well controlled - wants to try to wean off Wellbutrin  completely- had gone down to 150 from 300 previously- he will take half of 75 mg instant release that we sent in today for 2-3 weeks then can stop completely  # BPH S:tamsulosin  not helpful last year  A/P: symptoms better- not sure what changed   #travel- upcoming travel- uses with travel and needs refill- 3 weeks of travel  Recommended follow up: Return in about 1 year (around 07/21/2024) for physical or sooner if needed.Schedule b4 you leave.  Lab/Order associations: fasting   ICD-10-CM   1. Preventative health care  Z00.00     2. Screening for prostate cancer  Z12.5 PSA    3. Screening for diabetes mellitus  Z13.1 Hemoglobin A1c    4. Overweight  E66.3 Hemoglobin A1c    5. Hyperlipidemia, unspecified hyperlipidemia type  E78.5 Comprehensive metabolic panel with GFR    CBC with Differential/Platelet    Lipid panel    6. High risk medication use  Z79.899 Vitamin  B12      Meds ordered this encounter  Medications   doxycycline  (VIBRA -TABS) 100 MG tablet    Sig: Take 2 tablets  (200 mg total) by mouth once for 1 dose.    Dispense:  2 tablet    Refill:  0   buPROPion  (WELLBUTRIN ) 75 MG tablet    Sig: Take 0.5 tablets (37.5 mg total) by mouth 2 (two) times daily. After 2-3 weeks of this if still feeling well can stop completely.    Dispense:  30 tablet    Refill:  0   zolpidem  (AMBIEN ) 5 MG tablet    Sig: Take 1 tablet (5 mg total) by mouth at bedtime as needed for sleep.    Dispense:  21 tablet    Refill:  0    Return precautions advised.  Clarisa Crooked, MD

## 2023-10-03 ENCOUNTER — Other Ambulatory Visit (INDEPENDENT_AMBULATORY_CARE_PROVIDER_SITE_OTHER): Payer: Self-pay | Admitting: Family Medicine

## 2023-10-13 ENCOUNTER — Other Ambulatory Visit: Payer: Self-pay

## 2023-10-13 ENCOUNTER — Telehealth: Payer: Self-pay | Admitting: Family Medicine

## 2023-10-13 MED ORDER — VALACYCLOVIR HCL 1 G PO TABS: ORAL_TABLET | ORAL | 1 refills | Status: DC

## 2023-10-13 NOTE — Telephone Encounter (Signed)
 Prescription Request  10/13/2023  LOV: 07/22/2023  What is the name of the medication or equipment?   valACYclovir  (VALTREX ) 1000 MG tablet   Have you contacted your pharmacy to request a refill? No   Which pharmacy would you like this sent to?  [id number] - [place], [place] - [address] AT [facility] OF [address] [place] [address] Phone: [phone] Fax: [phone]    Patient notified that their request is being sent to the clinical staff for review and that they should receive a response within 2 business days.   Please advise at Mobile [phone] (mobile)

## 2023-10-13 NOTE — Telephone Encounter (Signed)
 Refill sent to pharmacy.

## 2023-10-14 ENCOUNTER — Other Ambulatory Visit (INDEPENDENT_AMBULATORY_CARE_PROVIDER_SITE_OTHER)

## 2023-10-14 DIAGNOSIS — E871 Hypo-osmolality and hyponatremia: Secondary | ICD-10-CM | POA: Diagnosis not present

## 2023-10-14 LAB — COMPREHENSIVE METABOLIC PANEL WITH GFR
ALT: 32 U/L (ref 0–53)
AST: 31 U/L (ref 0–37)
Albumin: 4.7 g/dL (ref 3.5–5.2)
Alkaline Phosphatase: 48 U/L (ref 39–117)
BUN: 10 mg/dL (ref 6–23)
CO2: 28 meq/L (ref 19–32)
Calcium: 9.2 mg/dL (ref 8.4–10.5)
Chloride: 97 meq/L (ref 96–112)
Creatinine, Ser: 0.84 mg/dL (ref 0.40–1.50)
GFR: 94.13 mL/min (ref >60.00)
Glucose, Bld: 92 mg/dL (ref 70–99)
Potassium: 4.1 meq/L (ref 3.5–5.1)
Sodium: 132 meq/L — ABNORMAL LOW (ref 135–145)
Total Bilirubin: 1.1 mg/dL (ref 0.2–1.2)
Total Protein: 7.4 g/dL (ref 6.0–8.3)

## 2023-10-15 NOTE — Procedures (Signed)
Mask fit

## 2023-10-16 ENCOUNTER — Ambulatory Visit: Payer: Self-pay | Admitting: Family Medicine

## 2023-10-16 LAB — OSMOLALITY: Osmolality: 273 mosm/kg — ABNORMAL LOW (ref 278–305)

## 2023-10-16 LAB — SODIUM, URINE, RANDOM: Sodium, Ur: 24 mmol/L — ABNORMAL LOW (ref 28–272)

## 2023-10-16 LAB — OSMOLALITY, URINE: Osmolality, Ur: 218 mosm/kg (ref 50–1200)

## 2023-11-16 IMAGING — DX DG CERVICAL SPINE COMPLETE 4+V
5 series · 5 of 5 positions shown · non-contrast
Comparison: None Available.

CLINICAL DATA: Left arm paresthesias.

EXAM:
CERVICAL SPINE - COMPLETE 4+ VIEW

[c-spine lat]
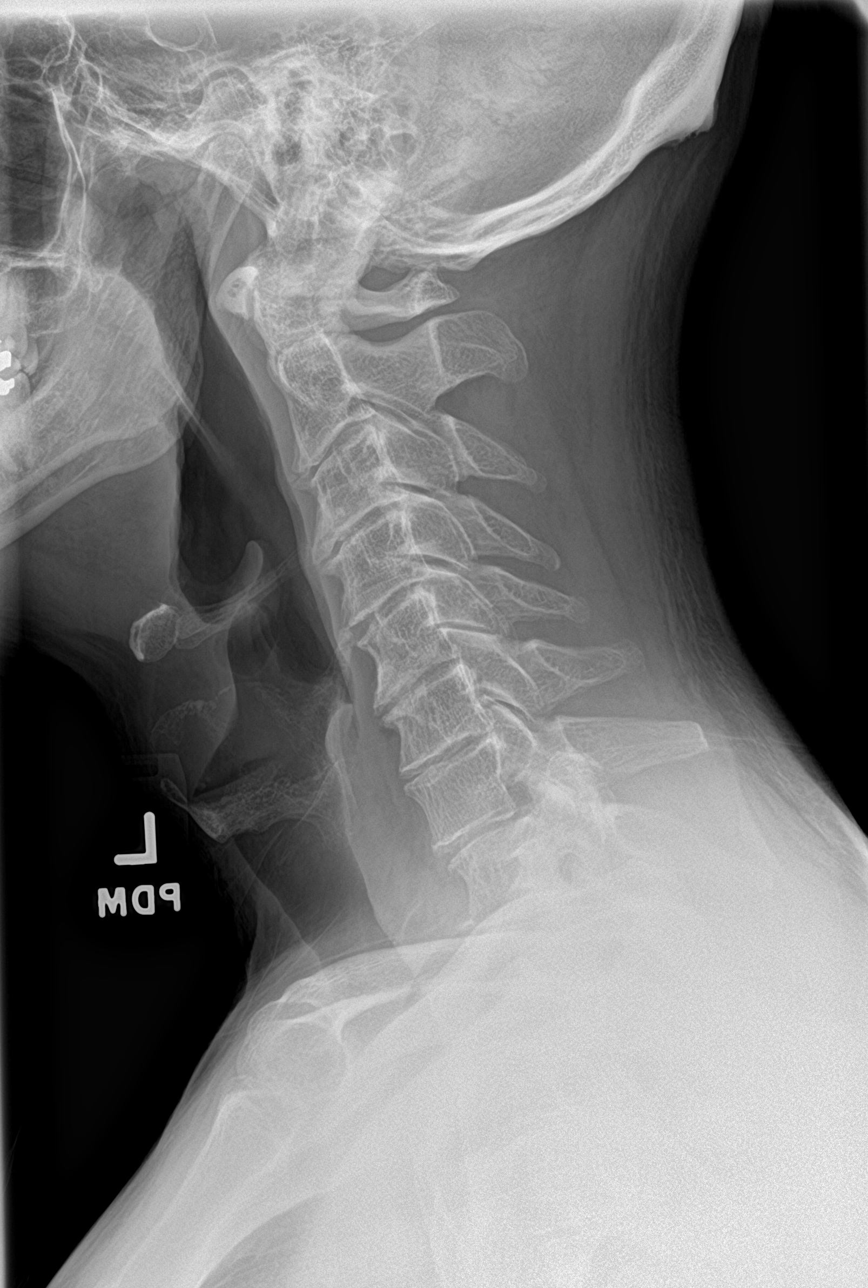

[c-spine obl (1 of 2)]
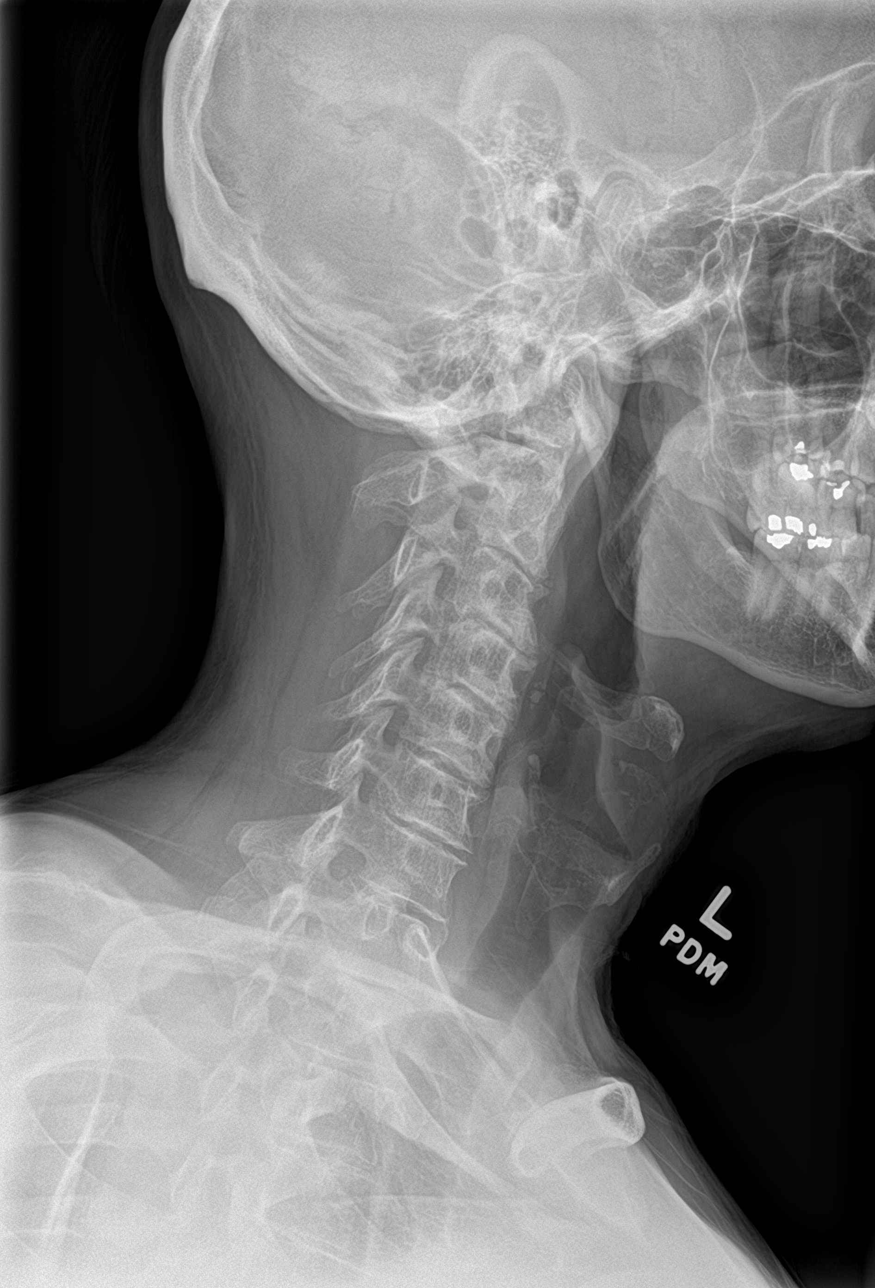

[c-spine obl (2 of 2)]
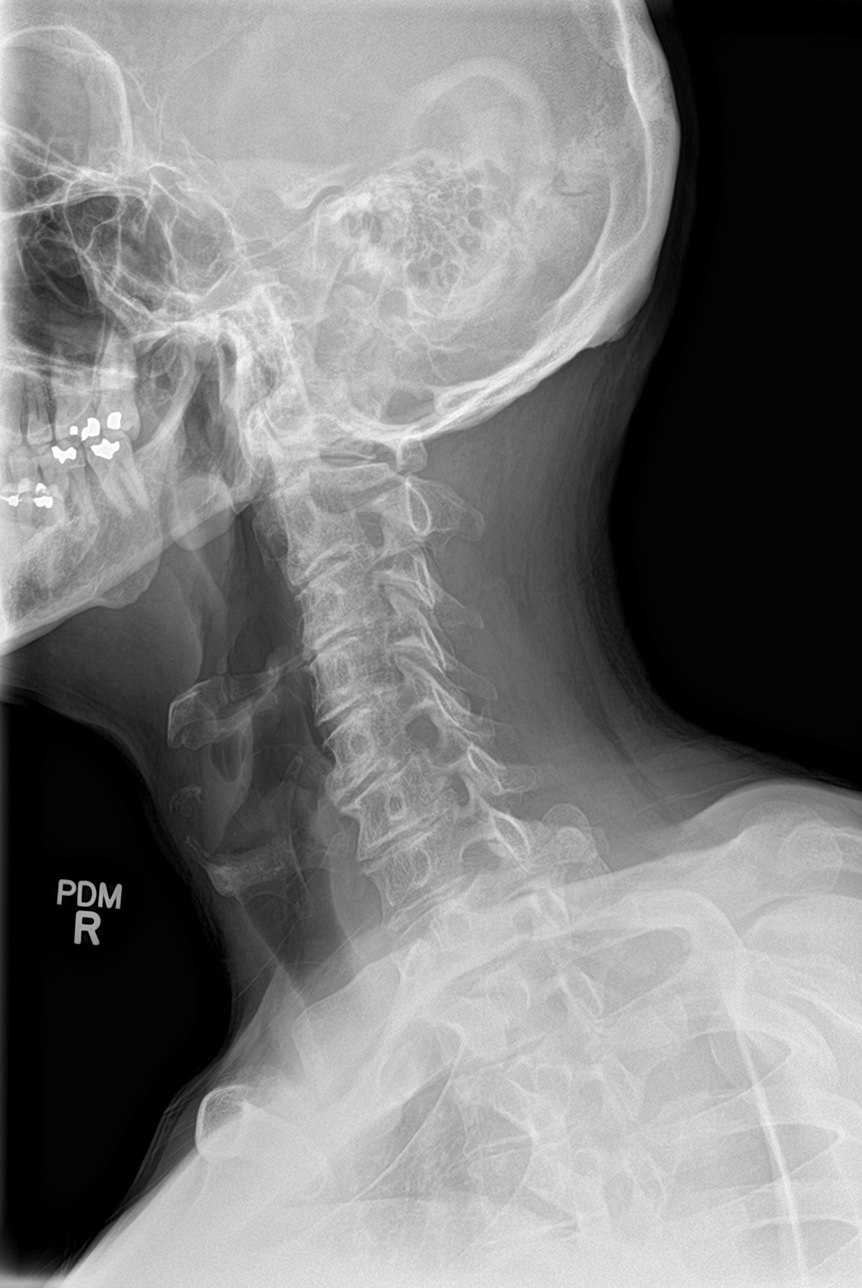

[c-spine ap]
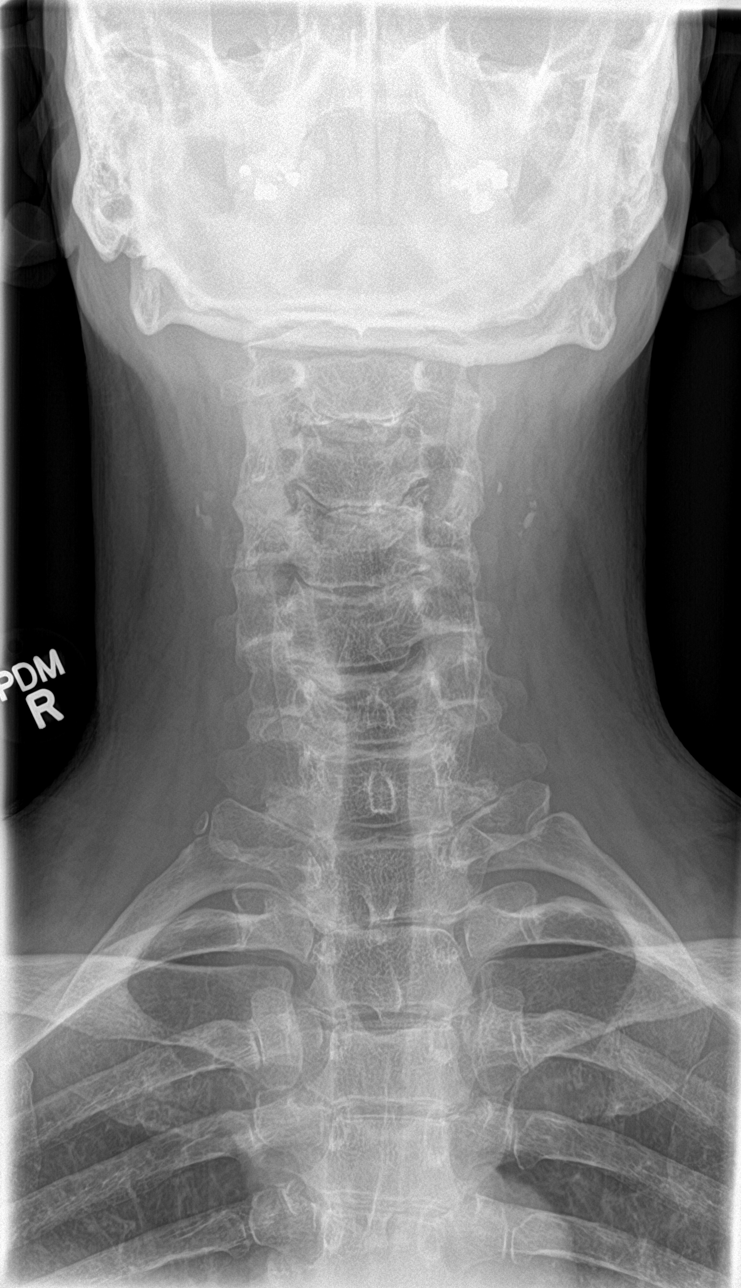

[c-spine open mouth]
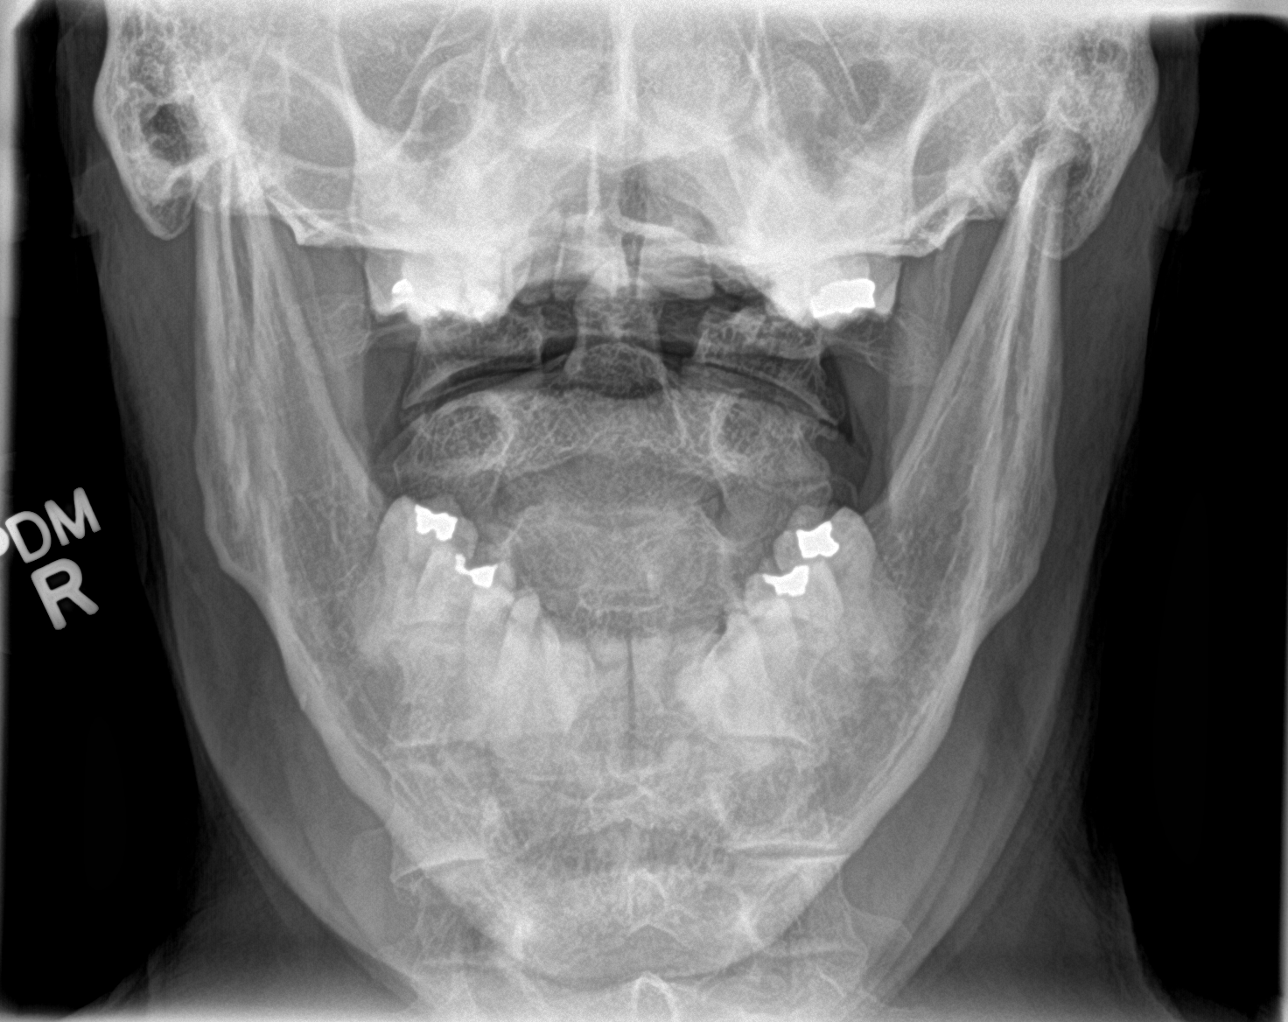

[5 of 5 positions shown; findings below may reference images not displayed]

FINDINGS: Straightening of the normal cervical lordosis. Visualization through
T1 on lateral view. Multilevel degenerative disc disease with disc
space narrowing and anterior endplate osteophytosis. Preservation of
the vertebral body heights. Bilateral multilevel osseous neural
foraminal narrowing most pronounced left C3-4 and C4-5 as well as
right C3-4. Lateral masses articulate appropriately with the dens.
Lung apices are clear. Carotid vascular calcifications.
IMPRESSION: Multilevel degenerative disc and facet disease with osseous neural
foraminal narrowing most pronounced C3-4.

Bilateral carotid vascular calcifications.

## 2023-12-05 ENCOUNTER — Other Ambulatory Visit: Payer: Self-pay | Admitting: Family Medicine

## 2023-12-16 ENCOUNTER — Encounter: Payer: Self-pay | Admitting: Family Medicine

## 2023-12-16 MED ORDER — ZOLPIDEM TARTRATE 5 MG PO TABS: 5.0000 mg | ORAL_TABLET | Freq: Every evening | ORAL | 0 refills | Status: AC | PRN

## 2023-12-31 ENCOUNTER — Other Ambulatory Visit: Payer: Self-pay | Admitting: Family Medicine

## 2024-01-04 ENCOUNTER — Ambulatory Visit (INDEPENDENT_AMBULATORY_CARE_PROVIDER_SITE_OTHER): Admitting: Otolaryngology

## 2024-01-04 ENCOUNTER — Encounter (INDEPENDENT_AMBULATORY_CARE_PROVIDER_SITE_OTHER): Payer: Self-pay | Admitting: Otolaryngology

## 2024-01-04 VITALS — BP 148/82 | HR 61 | Temp 98.0°F | Ht 74.0 in | Wt 212.0 lb

## 2024-01-04 DIAGNOSIS — R0981 Nasal congestion: Secondary | ICD-10-CM

## 2024-01-04 DIAGNOSIS — J343 Hypertrophy of nasal turbinates: Secondary | ICD-10-CM

## 2024-01-04 DIAGNOSIS — H9042 Sensorineural hearing loss, unilateral, left ear, with unrestricted hearing on the contralateral side: Secondary | ICD-10-CM | POA: Diagnosis not present

## 2024-01-04 DIAGNOSIS — H903 Sensorineural hearing loss, bilateral: Secondary | ICD-10-CM

## 2024-01-04 DIAGNOSIS — J3489 Other specified disorders of nose and nasal sinuses: Secondary | ICD-10-CM

## 2024-01-04 DIAGNOSIS — K219 Gastro-esophageal reflux disease without esophagitis: Secondary | ICD-10-CM

## 2024-01-04 DIAGNOSIS — H918X3 Other specified hearing loss, bilateral: Secondary | ICD-10-CM

## 2024-01-04 DIAGNOSIS — J342 Deviated nasal septum: Secondary | ICD-10-CM | POA: Diagnosis not present

## 2024-01-04 NOTE — Patient Instructions (Addendum)
 Septoplasty and Bilateral Inferior Turbinate Reduction Inspire: [email or web address] MRI --- Please call [facility] Scheduling at [phone] to schedule your imaging if you have not received a call within 24 hours. If you are unable to complete your imaging study prior to your next scheduled visit please call our office to let us  know.

## 2024-01-04 NOTE — Progress Notes (Signed)
 Dear Dr. Katrinka, Here is my assessment for our mutual patient, Curtis Hendrix. Thank you for allowing me the opportunity to care for your patient. Please do not hesitate to contact me should you have any other questions. Sincerely, Dr. Eldora Blanch  Otolaryngology Clinic Note Referring provider: Dr. Katrinka HPI:  Curtis Hendrix is a 63 y.o. male kindly referred by Dr. Katrinka for evaluation of sore throat and ear complaints  Initial visit (12/2022): Patient reports that he has had a scratchy throat for the past 3-4 months - happens throughout the day. Coughing up mucus AM mostly. Maybe had some URI symptoms for about a week so ago, worse right now. No coughing most of the day. No antecedent event. He denies voice changes, dysphagia, odynophagia, hemoptysis, unintentional weight loss, neck masses, overt aspiration episodes, PNAs. No significant throat clearing - some post nasal drip. No significant allergy symptoms. Does spend a lot of time outside. Perhaps some reflux symptoms? Has not tried anything for it including lozenges Ear infections as a child but no surgeries except tubes as a child He has never tried amplification  Never smoked  Audiology: he feels like his hearing has gone down - gradual change but he's had known hearing loss decades ago. Never had MRI IAC, saw Dr. Ethyl prior. No other otologic symptoms including pain, pressure, fullness, vertigo, facial numbness, ETD symptoms, facial weakness. He does hunt but wears hearing protection for at least 20 years; gun on right shoulder. No autoimmune disease, lyme disease. No facial numbness, pain, headaches, vision changes. No imbalance --------------------------------------------------------- 01/04/2024 Did not get MRI IAC, but did not have the number. He also reports that he is having some snoring and nasal congestion.  Has trouble always breathing out of the nose. Mostly on the left. He reports that he did break his nose as a child and  then again in high school. No recent sinus infections with frequency. Does not use any nasal medications. Also noted to have OSA diagnosed in 2016, does use CPAP at night (full mask and half mask) --- generally falls asleep with it and has trouble tolerating it as he often takes it off when he rolls over in bed. No issues with swallowing or breathing. No chest surgery. Last sleep study was several years ago.  No ear pain, drainage, issues with vertigo or facial weakness or numbness  PMHx: OSA on CPAP, HTN, Depression, HLD  H&N Surgery: Tonsillectomy Personal or FHx of bleeding dz or anesthesia difficulty: no   GLP-1: no AP/AC: no  Independent Review of Additional Tests or Records:  01/14/2023 Audiogram was independently reviewed and interpreted by me and it reveals Left ear: normal downsloping to profound - appears SNHL; asymmetric; 76% word interpretation at 85dB; type 76 tympanogram Right ear: normal downsloping to mild then upsloping to normal SNHL; 96% word interpretation at 15dB; type A tympanogram   SNHL= Sensorineural hearing loss   MRI 12/2014: reviewed independently, agree with read; no obvious IAC         07/22/2023 Dr. Katrinka: Noted tick bite 3 days ago, hearing loss, will call to schedule MRI; sore throat - start on PPI, no further issues. Rx: Doxy CBC and CMP 07/22/2023: WBC 4.9, BUN/Cr 10/0.94  Dr. Jude (2020) 2020 notes -- noted AHI 28/hour, PLM and lowest sat 85% PMH/Meds/All/SocHx/FamHx/ROS:   Past Medical History:  Diagnosis Date   Arthritis    Depression    Fracture of rib of left side 04/2013   Hearing difficulty    left  ear-since college. high frequency loss.    Hypertension    Personal history of colonic adenomas 02/23/2013   02/23/2013 6 small polyps removed, 5 recovered - all adenomas repeat colonoscopy 02/2016     Radiculopathy, lumbar region    Sleep apnea    mild-uses CPAP     Past Surgical History:  Procedure Laterality Date   COLONOSCOPY   04/14/2016   [facility]   EYE SURGERY     lumbar lamninectomy  2008   l4-l5 for drop foot   POLYPECTOMY     SPINE SURGERY     TONSILLECTOMY     TYMPANOSTOMY     tubes as child   WISDOM TOOTH EXTRACTION      Family History  Problem Relation Age of Onset   Transient ischemic attack Mother        age 33   Bladder Cancer Father    Kidney cancer Father    Cancer Father    Kidney disease Father    Kidney cancer Brother        renal cell carcinoma age 54- blood in urine   Colon cancer Neg Hx    Esophageal cancer Neg Hx    Rectal cancer Neg Hx    Stomach cancer Neg Hx    Colon polyps Neg Hx      Social Connections: Socially Integrated (07/19/2023)   Social Connection and Isolation Panel    Frequency of Communication with Friends and Family: Three times a week    Frequency of Social Gatherings with Friends and Family: Twice a week    Attends Religious Services: More than 4 times per year    Active Member of [employer] or Organizations: Yes    Attends Engineer, structural: More than 4 times per year    Marital Status: Married      Current Outpatient Medications:    buPROPion  (WELLBUTRIN ) 75 MG tablet, Take 0.5 tablets (37.5 mg total) by mouth 2 (two) times daily. After 2-3 weeks of this if still feeling well can stop completely., Disp: 30 tablet, Rfl: 0   lisinopril  (ZESTRIL ) 10 MG tablet, Take 1 tablet (10 mg total) by mouth daily., Disp: 90 tablet, Rfl: 3   Multiple Vitamins-Minerals (ONE-A-DAY MENS HEALTH FORMULA) TABS, Take by mouth., Disp: , Rfl:    Omega 3 1000 MG CAPS, Take 2 capsules by mouth daily., Disp: , Rfl:    pantoprazole  (PROTONIX ) 40 MG tablet, TAKE 1 TABLET BY MOUTH EVERY DAY, Disp: 90 tablet, Rfl: 1   rosuvastatin  (CRESTOR ) 20 MG tablet, TAKE 1 TABLET BY MOUTH EVERY DAY, Disp: 90 tablet, Rfl: 3   Study - CAPTIVA - aspirin 81 mg tablet (PI-Sethi), Chew 1 tablet by mouth daily., Disp: , Rfl:    valACYclovir  (VALTREX ) 1000 MG tablet, TAKE 2 PILLS TWICE A DAY FOR  1 DAY AT FIRST SIGN OF COLD SORE, Disp: 30 tablet, Rfl: 1   zolpidem  (AMBIEN ) 5 MG tablet, Take 1 tablet (5 mg total) by mouth at bedtime as needed for sleep., Disp: 21 tablet, Rfl: 0   Physical Exam:   There were no vitals taken for this visit.   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 ULC modestly cephalically oriented, mod cottle mild pos left No lesions of oral cavity/oropharynx; Tongue friedman 3 No obviously palpable neck masses/lymphadenopathy/thyromegaly No respiratory distress or stridor; voice quality class 1  Procedures:  Procedure Note PROCEDURE: Bilateral Diagnostic Rigid Nasal Endoscopy Pre-procedure diagnosis: Concern for nasal congestion, nasal septal deviation Post-procedure diagnosis: same Indication: See pre-procedure diagnosis and physical exam above Complications: None apparent EBL: 0 mL Anesthesia: Lidocaine 4% and topical decongestant was topically sprayed in each nasal cavity  Description of Procedure:  Patient was identified. A rigid 30 degree endoscope was utilized to evaluate the sinonasal cavities, mucosa, sinus ostia and turbinates and septum.  Overall, signs of mucosal inflammation are not noted.  No mucopurulence, polyps, or masses noted.   Right Middle meatus: clear Right SE Recess: clear Left MM: clear Left SE Recess: clear Photodocumentation was obtained.  CPT CODE -- 68768 - Mod 25    Impression & Plans:  Curtis Hendrix is a 63 y.o. 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 He reports that he had a few audiograms over the years and his hearing thresholds appear to be same, but these are still not available for my review and he says it has been several years since this was checked. It is unclear to me what has precipitated this - he does have noise exposure but reports he wears hearing protection. Did not get MRI IAC yet -- will get again 2. Sore throat: resolved after PPI 3. Nasal congestion: discussed likely structural cause -- septal dev, turbinate hypertrophy; some NV collapse but does not wish for change in appearance; risks septo/turbs discussed. He will think about this. No significant allergy sx 4. OSA - Last sleep test 2016. We discussed that if AHI still 28, can consider [facility] and we had a detailed discussion re: R/B/A. Provided resources. He will think about it and let us  know if he'd like to proceed. Will need updated sleep referral and test   F/u 3 month phone visit to discuss MRI, sooner as necessary  See below regarding exact medications prescribed this encounter including dosages and route: No orders of the defined types were placed in this encounter.    Thank you for allowing me the opportunity to care for your patient. Please do not hesitate to contact me should you have any other questions.  Sincerely, Eldora Blanch, MD Otolaryngologist (ENT), [facility] Phone: [phone] Fax: [phone]  01/04/2024, 7:54 AM   I have personally spent 41 minutes involved in face-to-face and non-face-to-face activities for this patient on the day of the visit.  Professional time spent excludes any procedures performed but includes the following activities, in addition to those noted in the documentation: preparing to see the patient (review of outside documentation and results), performing a medically appropriate examination, extensive counseling re:  multiple problems, documenting in the electronic health record,

## 2024-02-11 ENCOUNTER — Encounter: Payer: Self-pay | Admitting: Family Medicine

## 2024-02-14 ENCOUNTER — Other Ambulatory Visit: Payer: Self-pay

## 2024-02-14 MED ORDER — ROSUVASTATIN CALCIUM 20 MG PO TABS: 20.0000 mg | ORAL_TABLET | Freq: Every day | ORAL | 3 refills | Status: AC

## 2024-02-14 MED ORDER — LISINOPRIL 10 MG PO TABS: 10.0000 mg | ORAL_TABLET | Freq: Every day | ORAL | 3 refills | Status: AC

## 2024-03-21 ENCOUNTER — Telehealth (INDEPENDENT_AMBULATORY_CARE_PROVIDER_SITE_OTHER): Payer: Self-pay | Admitting: Otolaryngology

## 2024-03-21 DIAGNOSIS — H918X3 Other specified hearing loss, bilateral: Secondary | ICD-10-CM

## 2024-03-21 NOTE — Telephone Encounter (Signed)
 Re-ordered MRI IAC

## 2024-04-01 ENCOUNTER — Other Ambulatory Visit (INDEPENDENT_AMBULATORY_CARE_PROVIDER_SITE_OTHER): Payer: Self-pay | Admitting: Family Medicine

## 2024-04-03 ENCOUNTER — Ambulatory Visit (INDEPENDENT_AMBULATORY_CARE_PROVIDER_SITE_OTHER): Admitting: Otolaryngology

## 2024-04-03 DIAGNOSIS — H903 Sensorineural hearing loss, bilateral: Secondary | ICD-10-CM

## 2024-04-03 DIAGNOSIS — H918X3 Other specified hearing loss, bilateral: Secondary | ICD-10-CM

## 2024-04-03 NOTE — Progress Notes (Signed)
 No MRI: will reschedule visit until after his MRI (~8 weeks) Cephus Tupy B Tionna Gigante

## 2024-04-04 ENCOUNTER — Telehealth (INDEPENDENT_AMBULATORY_CARE_PROVIDER_SITE_OTHER): Payer: Self-pay | Admitting: Otolaryngology

## 2024-04-04 NOTE — Telephone Encounter (Signed)
 Left voice message for patient to call back to schedule 8 wk telephone visit for imaging follow-up

## 2024-04-05 ENCOUNTER — Ambulatory Visit (HOSPITAL_COMMUNITY)
Admission: RE | Admit: 2024-04-05 | Discharge: 2024-04-05 | Disposition: A | Discharge status: Home / Self Care | Source: Ambulatory Visit | Attending: Otolaryngology | Admitting: Otolaryngology

## 2024-04-05 DIAGNOSIS — H918X9 Other specified hearing loss, unspecified ear: Secondary | ICD-10-CM | POA: Diagnosis not present

## 2024-04-05 DIAGNOSIS — H918X3 Other specified hearing loss, bilateral: Secondary | ICD-10-CM | POA: Insufficient documentation

## 2024-04-05 MED ORDER — GADOBUTROL 1 MMOL/ML IV SOLN
9.5000 mL | Freq: Once | INTRAVENOUS | Status: AC | PRN
  Administered 2024-04-05: 9.5 mL via INTRAVENOUS

## 2024-05-04 ENCOUNTER — Ambulatory Visit: Admitting: Psychology
